# Patient Record
Sex: Female | Born: 1945 | Race: White | Hispanic: No | Marital: Married | Smoking: Never smoker
Health system: Southern US, Community
[De-identification: ages and names within clinical notes are randomized; demographics above are authoritative.]

---

## 2013-10-06 HISTORY — PX: BREAST BIOPSY: SHX20

## 2016-05-09 DIAGNOSIS — B079 Viral wart, unspecified: Secondary | ICD-10-CM | POA: Insufficient documentation

## 2020-05-29 HISTORY — PX: BREAST BIOPSY: SHX20

## 2020-08-07 ENCOUNTER — Other Ambulatory Visit: Payer: Self-pay | Admitting: Family Medicine

## 2020-08-07 ENCOUNTER — Other Ambulatory Visit (HOSPITAL_COMMUNITY): Payer: Self-pay | Admitting: Family Medicine

## 2020-08-07 DIAGNOSIS — T148XXA Other injury of unspecified body region, initial encounter: Secondary | ICD-10-CM

## 2020-08-07 DIAGNOSIS — S60352D Superficial foreign body of left thumb, subsequent encounter: Secondary | ICD-10-CM

## 2020-08-09 ENCOUNTER — Other Ambulatory Visit: Payer: Self-pay

## 2020-08-09 ENCOUNTER — Ambulatory Visit
Admission: RE | Admit: 2020-08-09 | Discharge: 2020-08-09 | Disposition: A | Discharge status: Home / Self Care | Payer: Medicare Other | Source: Ambulatory Visit | Attending: Family Medicine | Admitting: Family Medicine

## 2020-08-09 DIAGNOSIS — T148XXA Other injury of unspecified body region, initial encounter: Secondary | ICD-10-CM | POA: Insufficient documentation

## 2020-08-09 DIAGNOSIS — S60352D Superficial foreign body of left thumb, subsequent encounter: Secondary | ICD-10-CM | POA: Insufficient documentation

## 2021-08-06 ENCOUNTER — Other Ambulatory Visit: Payer: Self-pay | Admitting: Obstetrics and Gynecology

## 2021-08-10 ENCOUNTER — Other Ambulatory Visit: Payer: Self-pay | Admitting: Obstetrics and Gynecology

## 2021-08-10 DIAGNOSIS — Z9889 Other specified postprocedural states: Secondary | ICD-10-CM

## 2021-08-10 DIAGNOSIS — N649 Disorder of breast, unspecified: Secondary | ICD-10-CM

## 2021-09-05 ENCOUNTER — Other Ambulatory Visit: Payer: Medicare Other

## 2021-09-18 ENCOUNTER — Ambulatory Visit
Admission: RE | Admit: 2021-09-18 | Discharge: 2021-09-18 | Disposition: A | Discharge status: Home / Self Care | Payer: Medicare Other | Source: Ambulatory Visit | Attending: Obstetrics and Gynecology | Admitting: Obstetrics and Gynecology

## 2021-09-18 ENCOUNTER — Other Ambulatory Visit: Payer: Self-pay

## 2021-09-18 DIAGNOSIS — Z9889 Other specified postprocedural states: Secondary | ICD-10-CM

## 2021-09-18 DIAGNOSIS — N649 Disorder of breast, unspecified: Secondary | ICD-10-CM | POA: Diagnosis present

## 2021-10-16 DIAGNOSIS — M792 Neuralgia and neuritis, unspecified: Secondary | ICD-10-CM | POA: Insufficient documentation

## 2021-12-10 ENCOUNTER — Ambulatory Visit (INDEPENDENT_AMBULATORY_CARE_PROVIDER_SITE_OTHER): Payer: Medicare Other | Admitting: Dermatology

## 2021-12-10 DIAGNOSIS — L988 Other specified disorders of the skin and subcutaneous tissue: Secondary | ICD-10-CM

## 2021-12-10 DIAGNOSIS — B079 Viral wart, unspecified: Secondary | ICD-10-CM

## 2021-12-10 DIAGNOSIS — D1800 Hemangioma unspecified site: Secondary | ICD-10-CM

## 2021-12-10 DIAGNOSIS — L209 Atopic dermatitis, unspecified: Secondary | ICD-10-CM

## 2021-12-10 DIAGNOSIS — L578 Other skin changes due to chronic exposure to nonionizing radiation: Secondary | ICD-10-CM

## 2021-12-10 DIAGNOSIS — I8393 Asymptomatic varicose veins of bilateral lower extremities: Secondary | ICD-10-CM

## 2021-12-10 DIAGNOSIS — L219 Seborrheic dermatitis, unspecified: Secondary | ICD-10-CM

## 2021-12-10 DIAGNOSIS — Z85828 Personal history of other malignant neoplasm of skin: Secondary | ICD-10-CM

## 2021-12-10 DIAGNOSIS — L570 Actinic keratosis: Secondary | ICD-10-CM | POA: Diagnosis not present

## 2021-12-10 DIAGNOSIS — L820 Inflamed seborrheic keratosis: Secondary | ICD-10-CM | POA: Diagnosis not present

## 2021-12-10 DIAGNOSIS — Z1283 Encounter for screening for malignant neoplasm of skin: Secondary | ICD-10-CM | POA: Diagnosis not present

## 2021-12-10 DIAGNOSIS — L918 Other hypertrophic disorders of the skin: Secondary | ICD-10-CM

## 2021-12-10 DIAGNOSIS — L821 Other seborrheic keratosis: Secondary | ICD-10-CM

## 2021-12-10 MED ORDER — KETOCONAZOLE 2 % EX SHAM: MEDICATED_SHAMPOO | CUTANEOUS | 11 refills | Status: DC

## 2021-12-10 MED ORDER — MOMETASONE FUROATE 0.1 % EX CREA: TOPICAL_CREAM | CUTANEOUS | 3 refills | Status: DC

## 2021-12-10 NOTE — Progress Notes (Signed)
New Patient Visit  Subjective  Teresa Cohen is a 76 y.o. 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 Spots at nose , right bridge of nose, left upper lip, left side of face, spot at right knee, right shoulder, growth at right inframammary. Reports a possible at right 4th finger that wont go away. Also reports itchy skin on scalp. ). The patient presents for Total-Body Skin Exam (TBSE) for skin cancer screening and mole check.  The patient has spots, moles and lesions to be evaluated, some may be new or changing and the patient has concerns that these could be cancer.  The following portions of the chart were reviewed this encounter and updated as appropriate:   Allergies  Meds  Problems  Med Hx  Surg Hx  Fam Hx     Review of Systems:  No other skin or systemic complaints except as noted in HPI or Assessment and Plan.  Objective  Well appearing patient in no apparent distress; mood and affect are within normal limits.  A full examination was performed including scalp, head, eyes, ears, nose, lips, neck, chest, axillae, abdomen, back, buttocks, bilateral upper extremities, bilateral lower extremities, hands, feet, fingers, toes, fingernails, and toenails. All findings within normal limits unless otherwise noted below.  Right 4th Finger Tip 0.3 cm wart   right hand base of thumb x 1, right tricep x 1, right shoulder x 1, right inframammary x 1, right knee x 1 (5) Erythematous stuck-on, waxy papule or plaque  left dorsum hand x 1 Erythematous thin papules/macules with gritty scale.   face Rhytides and volume loss.    Assessment & Plan  Atopic dermatitis, unspecified type b/l postauricular areas Atopic dermatitis (eczema) is a chronic, relapsing, pruritic condition that can significantly affect quality of life. It is often associated with allergic rhinitis and/or asthma and can  require treatment with topical medications, phototherapy, or in severe cases biologic injectable medication (Dupixent; Adbry) or Oral JAK inhibitors.  Mometasone use 5 days weekly to aa's daily   mometasone (ELOCON) 0.1 % cream - b/l postauricular areas Apply topically to aa's of ears daily 5 x weekly for eczema  Seborrheic dermatitis Scalp Seborrheic Dermatitis  -  is a chronic persistent rash characterized by pinkness and scaling most commonly of the mid face but also can occur on the scalp (dandruff), ears; mid chest, mid back and groin.  It tends to be exacerbated by stress and cooler weather.  People who have neurologic disease may experience new onset or exacerbation of existing seborrheic dermatitis.  The condition is not curable but treatable and can be controlled.  Ketoconazole shampoo - use 3 x weekly apply three times per week, massage into scalp and leave in for 10 minutes before rinsing out  ketoconazole (NIZORAL) 2 % shampoo - Scalp apply three times per week, massage into scalp and leave for a few minutes before rinsing out  Viral warts, unspecified type Right 4th Finger Tip Discussed viral etiology and risk of spread.  Discussed multiple treatments may be required to clear warts.  Discussed possible post-treatment dyspigmentation and risk of recurrence.  Destruction of lesion - Right 4th Finger Tip Complexity: simple   Destruction method: cryotherapy   Informed consent: discussed and consent obtained   Timeout:  patient name, date of birth, surgical site, and procedure verified Lesion destroyed using liquid nitrogen: Yes   Region frozen until ice  ball extended beyond lesion: Yes   Outcome: patient tolerated procedure well with no complications   Post-procedure details: wound care instructions given   Additional details:  Prior to procedure, discussed risks of blister formation, small wound, skin dyspigmentation, or rare scar following cryotherapy. Recommend Vaseline  ointment to treated areas while healing.  Inflamed seborrheic keratosis (5) right hand base of thumb x 1, right tricep x 1, right shoulder x 1, right inframammary x 1, right knee x 1  Symptomatic, irritating, patient would like treated.  Destruction of lesion - right hand base of thumb x 1, right tricep x 1, right shoulder x 1, right inframammary x 1, right knee x 1 Complexity: simple   Destruction method: cryotherapy   Informed consent: discussed and consent obtained   Timeout:  patient name, date of birth, surgical site, and procedure verified Lesion destroyed using liquid nitrogen: Yes   Region frozen until ice ball extended beyond lesion: Yes   Outcome: patient tolerated procedure well with no complications   Post-procedure details: wound care instructions given   Additional details:  Prior to procedure, discussed risks of blister formation, small wound, skin dyspigmentation, or rare scar following cryotherapy. Recommend Vaseline ointment to treated areas while healing.  Actinic keratosis left dorsum hand x 1 Actinic keratoses are precancerous spots that appear secondary to cumulative UV radiation exposure/sun exposure over time. They are chronic with expected duration over 1 year. A portion of actinic keratoses will progress to squamous cell carcinoma of the skin. It is not possible to reliably predict which spots will progress to skin cancer and so treatment is recommended to prevent development of skin cancer.  Recommend daily broad spectrum sunscreen SPF 30+ to sun-exposed areas, reapply every 2 hours as needed.  Recommend staying in the shade or wearing long sleeves, sun glasses (UVA+UVB protection) and wide brim hats (4-inch brim around the entire circumference of the hat). Call for new or changing lesions.  Destruction of lesion - left dorsum hand x 1 Complexity: simple   Destruction method: cryotherapy   Informed consent: discussed and consent obtained   Timeout:  patient  name, date of birth, surgical site, and procedure verified Lesion destroyed using liquid nitrogen: Yes   Region frozen until ice ball extended beyond lesion: Yes   Outcome: patient tolerated procedure well with no complications   Post-procedure details: wound care instructions given   Additional details:  Prior to procedure, discussed risks of blister formation, small wound, skin dyspigmentation, or rare scar following cryotherapy. Recommend Vaseline ointment to treated areas while healing.  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  Typically we recommend 3 treatment sessions about 5-8 weeks apart for best results.  The patient's condition may require "maintenance treatments" in the future.  The fee for BBL / laser treatments is $350 per treatment session for the whole face.  A fee can be quoted for other parts of the body. Insurance typically does not pay for BBL/laser treatments and therefore the fee is an out-of-pocket cost.  Lentigines - Scattered tan macules - Due to sun exposure - Benign-appearing, observe - Recommend daily broad spectrum sunscreen SPF 30+ to sun-exposed areas, reapply every 2 hours as needed. - Call for any changes  Seborrheic Keratoses - Stuck-on, waxy, tan-brown papules and/or plaques  - Benign-appearing - Discussed benign etiology and prognosis. - Observe - Call for any  changes  Melanocytic Nevi - Tan-brown and/or pink-flesh-colored symmetric macules and papules - Benign appearing on exam today - Observation - Call clinic for new or changing moles - Recommend daily use of broad spectrum spf 30+ sunscreen to sun-exposed areas.   Hemangiomas - Red papules - Discussed benign nature - Observe - Call for any changes  Varicose Veins/Spider Veins - Dilated blue, purple or red veins at the lower extremities - Reassured - Smaller  vessels can be treated by sclerotherapy (a procedure to inject a medicine into the veins to make them disappear) if desired, but the treatment is not covered by insurance. Larger vessels may be covered if symptomatic and we would refer to vascular surgeon if treatment desired.  Actinic Damage - Chronic condition, secondary to cumulative UV/sun exposure - diffuse scaly erythematous macules with underlying dyspigmentation - Recommend daily broad spectrum sunscreen SPF 30+ to sun-exposed areas, reapply every 2 hours as needed.  - Staying in the shade or wearing long sleeves, sun glasses (UVA+UVB protection) and wide brim hats (4-inch brim around the entire circumference of the hat) are also recommended for sun protection.  - Call for new or changing lesions.  History of Skin Cancer   At left ear 20 years ago, treated with Mohs Clear. Observe for recurrence.  Call clinic for new or changing lesions.   Recommend regular skin exams, daily broad-spectrum spf 30+ sunscreen use, and photoprotection.     Skin cancer screening performed today. Return for 3 - 4 month ak follow up, 1 year tbse .  IRuthell Rummage, CMA, am acting as scribe for Sarina Ser, MD. Documentation: I have reviewed the above documentation for accuracy and completeness, and I agree with the above.  Sarina Ser, MD

## 2021-12-10 NOTE — Patient Instructions (Addendum)
Actinic keratoses are precancerous spots that appear secondary to cumulative UV radiation exposure/sun exposure over time. They are chronic with expected duration over 1 year. A portion of actinic keratoses will progress to squamous cell carcinoma of the skin. It is not possible to reliably predict which spots will progress to skin cancer and so treatment is recommended to prevent development of skin cancer.  Recommend daily broad spectrum sunscreen SPF 30+ to sun-exposed areas, reapply every 2 hours as needed.  Recommend staying in the shade or wearing long sleeves, sun glasses (UVA+UVB protection) and wide brim hats (4-inch brim around the entire circumference of the hat). Call for new or changing lesions.   Cryotherapy Aftercare  Wash gently with soap and water everyday.   Apply Vaseline and Band-Aid daily until healed.    Seborrheic Keratosis  What causes seborrheic keratoses? Seborrheic keratoses are harmless, common skin growths that first appear during adult life.  As time goes by, more growths appear.  Some people may develop a large number of them.  Seborrheic keratoses appear on both covered and uncovered body parts.  They are not caused by sunlight.  The tendency to develop seborrheic keratoses can be inherited.  They vary in color from skin-colored to gray, brown, or even black.  They can be either smooth or have a rough, warty surface.   Seborrheic keratoses are superficial and look as if they were stuck on the skin.  Under the microscope this type of keratosis looks like layers upon layers of skin.  That is why at times the top layer may seem to fall off, but the rest of the growth remains and re-grows.    Treatment Seborrheic keratoses do not need to be treated, but can easily be removed in the office.  Seborrheic keratoses often cause symptoms when they rub on clothing or jewelry.  Lesions can be in the way of shaving.  If they become inflamed, they can cause itching, soreness, or  burning.  Removal of a seborrheic keratosis can be accomplished by freezing, burning, or surgery. If any spot bleeds, scabs, or grows rapidly, please return to have it checked, as these can be an indication of a skin cancer.  Melanoma ABCDEs  Melanoma is the most dangerous type of skin cancer, and is the leading cause of death from skin disease.  You are more likely to develop melanoma if you: Have light-colored skin, light-colored eyes, or red or blond hair Spend a lot of time in the sun Tan regularly, either outdoors or in a tanning bed Have had blistering sunburns, especially during childhood Have a close family member who has had a melanoma Have atypical moles or large birthmarks  Early detection of melanoma is key since treatment is typically straightforward and cure rates are extremely high if we catch it early.   The first sign of melanoma is often a change in a mole or a new dark spot.  The ABCDE system is a way of remembering the signs of melanoma.  A for asymmetry:  The two halves do not match. B for border:  The edges of the growth are irregular. C for color:  A mixture of colors are present instead of an even brown color. D for diameter:  Melanomas are usually (but not always) greater than 6mm - the size of a pencil eraser. E for evolution:  The spot keeps changing in size, shape, and color.  Please check your skin once per month between visits. You can use a small   mirror in front and a large mirror behind you to keep an eye on the back side or your body.   If you see any new or changing lesions before your next follow-up, please call to schedule a visit.  Please continue daily skin protection including broad spectrum sunscreen SPF 30+ to sun-exposed areas, reapplying every 2 hours as needed when you're outdoors.   Staying in the shade or wearing long sleeves, sun glasses (UVA+UVB protection) and wide brim hats (4-inch brim around the entire circumference of the hat) are also  recommended for sun protection.    Due to recent changes in healthcare laws, you may see results of your pathology and/or laboratory studies on MyChart before the doctors have had a chance to review them. We understand that in some cases there may be results that are confusing or concerning to you. Please understand that not all results are received at the same time and often the doctors may need to interpret multiple results in order to provide you with the best plan of care or course of treatment. Therefore, we ask that you please give us 2 business days to thoroughly review all your results before contacting the office for clarification. Should we see a critical lab result, you will be contacted sooner.   If You Need Anything After Your Visit  If you have any questions or concerns for your doctor, please call our main line at [phone] and press option 4 to reach your doctor's medical assistant. If no one answers, please leave a voicemail as directed and we will return your call as soon as possible. Messages left after 4 pm will be answered the following business day.   You may also send us a message via MyChart. We typically respond to MyChart messages within 1-2 business days.  For prescription refills, please ask your pharmacy to contact our office. Our fax number is [phone].  If you have an urgent issue when the clinic is closed that cannot wait until the next business day, you can page your doctor at the number below.    Please note that while we do our best to be available for urgent issues outside of office hours, we are not available 24/7.   If you have an urgent issue and are unable to reach us, you may choose to seek medical care at your doctor's office, retail clinic, urgent care center, or emergency room.  If you have a medical emergency, please immediately call 911 or go to the emergency department.  Pager Numbers  - Dr. Kowalski: [phone]  - Dr. Moye:  [phone]  - Dr. Stewart: [phone]  In the event of inclement weather, please call our main line at [phone] for an update on the status of any delays or closures.  Dermatology Medication Tips: Please keep the boxes that topical medications come in in order to help keep track of the instructions about where and how to use these. Pharmacies typically print the medication instructions only on the boxes and not directly on the medication tubes.   If your medication is too expensive, please contact our office at [phone] option 4 or send us a message through MyChart.   We are unable to tell what your co-pay for medications will be in advance as this is different depending on your insurance coverage. However, we may be able to find a substitute medication at lower cost or fill out paperwork to get insurance to cover a needed medication.   If a prior authorization   is required to get your medication covered by your insurance company, please allow us 1-2 business days to complete this process.  Drug prices often vary depending on where the prescription is filled and some pharmacies may offer cheaper prices.  The website www.goodrx.com contains coupons for medications through different pharmacies. The prices here do not account for what the cost may be with help from insurance (it may be cheaper with your insurance), but the website can give you the price if you did not use any insurance.  - You can print the associated coupon and take it with your prescription to the pharmacy.  - You may also stop by our office during regular business hours and pick up a GoodRx coupon card.  - If you need your prescription sent electronically to a different pharmacy, notify our office through [facility] MyChart or by phone at [phone] option 4.     Si Usted Necesita Algo Despus de Su Visita  Tambin puede enviarnos un mensaje a travs de MyChart. Por lo general respondemos a los mensajes de  MyChart en el transcurso de 1 a 2 das hbiles.  Para renovar recetas, por favor pida a su farmacia que se ponga en contacto con nuestra oficina. Nuestro nmero de fax es el [phone].  Si tiene un asunto urgente cuando la clnica est cerrada y que no puede esperar hasta el siguiente da hbil, puede llamar/localizar a su doctor(a) al nmero que aparece a continuacin.   Por favor, tenga en cuenta que aunque hacemos todo lo posible para estar disponibles para asuntos urgentes fuera del horario de oficina, no estamos disponibles las 24 horas del da, los 7 das de la semana.   Si tiene un problema urgente y no puede comunicarse con nosotros, puede optar por buscar atencin mdica  en el consultorio de su doctor(a), en una clnica privada, en un centro de atencin urgente o en una sala de emergencias.  Si tiene una emergencia mdica, por favor llame inmediatamente al 911 o vaya a la sala de emergencias.  Nmeros de bper  - Dr. Kowalski: [phone]  - Dra. Moye: [phone]  - Dra. Stewart: [phone]  En caso de inclemencias del tiempo, por favor llame a nuestra lnea principal al [phone] para una actualizacin sobre el estado de cualquier retraso o cierre.  Consejos para la medicacin en dermatologa: Por favor, guarde las cajas en las que vienen los medicamentos de uso tpico para ayudarle a seguir las instrucciones sobre dnde y cmo usarlos. Las farmacias generalmente imprimen las instrucciones del medicamento slo en las cajas y no directamente en los tubos del medicamento.   Si su medicamento es muy caro, por favor, pngase en contacto con nuestra oficina llamando al [phone] y presione la opcin 4 o envenos un mensaje a travs de MyChart.   No podemos decirle cul ser su copago por los medicamentos por adelantado ya que esto es diferente dependiendo de la cobertura de su seguro. Sin embargo, es posible que podamos encontrar un medicamento sustituto a menor costo o  llenar un formulario para que el seguro cubra el medicamento que se considera necesario.   Si se requiere una autorizacin previa para que su compaa de seguros cubra su medicamento, por favor permtanos de 1 a 2 das hbiles para completar este proceso.  Los precios de los medicamentos varan con frecuencia dependiendo del lugar de dnde se surte la receta y alguna farmacias pueden ofrecer precios ms baratos.  El sitio web www.goodrx.com tiene cupones para medicamentos   de diferentes farmacias. Los precios aqu no tienen en cuenta lo que podra costar con la ayuda del seguro (puede ser ms barato con su seguro), pero el sitio web puede darle el precio si no utiliz ningn seguro.  - Puede imprimir el cupn correspondiente y llevarlo con su receta a la farmacia.  - Tambin puede pasar por nuestra oficina durante el horario de atencin regular y recoger una tarjeta de cupones de GoodRx.  - Si necesita que su receta se enve electrnicamente a una farmacia diferente, informe a nuestra oficina a travs de MyChart de [place] o por telfono llamando al [phone] y presione la opcin 4.  

## 2021-12-11 ENCOUNTER — Encounter: Payer: Self-pay | Admitting: Dermatology

## 2022-02-11 DIAGNOSIS — M659 Synovitis and tenosynovitis, unspecified: Secondary | ICD-10-CM | POA: Insufficient documentation

## 2022-02-13 DIAGNOSIS — M7751 Other enthesopathy of right foot: Secondary | ICD-10-CM | POA: Insufficient documentation

## 2022-02-14 DIAGNOSIS — M5136 Other intervertebral disc degeneration, lumbar region: Secondary | ICD-10-CM | POA: Insufficient documentation

## 2022-02-14 DIAGNOSIS — M7918 Myalgia, other site: Secondary | ICD-10-CM | POA: Insufficient documentation

## 2022-03-13 ENCOUNTER — Ambulatory Visit (INDEPENDENT_AMBULATORY_CARE_PROVIDER_SITE_OTHER): Payer: Medicare Other | Admitting: Dermatology

## 2022-03-13 DIAGNOSIS — B079 Viral wart, unspecified: Secondary | ICD-10-CM | POA: Diagnosis not present

## 2022-03-13 DIAGNOSIS — L820 Inflamed seborrheic keratosis: Secondary | ICD-10-CM

## 2022-03-13 DIAGNOSIS — L570 Actinic keratosis: Secondary | ICD-10-CM | POA: Diagnosis not present

## 2022-03-13 DIAGNOSIS — L578 Other skin changes due to chronic exposure to nonionizing radiation: Secondary | ICD-10-CM

## 2022-03-13 DIAGNOSIS — L719 Rosacea, unspecified: Secondary | ICD-10-CM | POA: Diagnosis not present

## 2022-03-13 DIAGNOSIS — L219 Seborrheic dermatitis, unspecified: Secondary | ICD-10-CM | POA: Diagnosis not present

## 2022-03-13 DIAGNOSIS — L821 Other seborrheic keratosis: Secondary | ICD-10-CM

## 2022-03-13 DIAGNOSIS — L814 Other melanin hyperpigmentation: Secondary | ICD-10-CM | POA: Diagnosis not present

## 2022-03-13 NOTE — Progress Notes (Signed)
Follow-Up Visit   Subjective  Teresa Cohen is a 76 y.o. female who presents for the following: Actinic Keratosis (Recheck previously treated area on the L hand dorsum, previously treated with LN2). Residual wart on the R 4th finger. New skin lesion on the R nasal bridge area. Residual ISK on the L thumb and R upper arm. The patient has spots, moles and lesions to be evaluated, some may be new or changing.  The following portions of the chart were reviewed this encounter and updated as appropriate:   Allergies  Meds  Problems  Med Hx  Surg Hx  Fam Hx     Review of Systems:  No other skin or systemic complaints except as noted in HPI or Assessment and Plan.  Objective  Well appearing patient in no apparent distress; mood and affect are within normal limits.  A focused examination was performed including the face and extremities. Relevant physical exam findings are noted in the Assessment and Plan.  Scalp, eyelids Pink patches with greasy scale.   L hand dorsum x 3, R nasal bridge x 1 (4) Erythematous thin papules/macules with gritty scale.   R 4th finger x 1 Verrucous papules -- Discussed viral etiology and contagion.   L thumb x 2, R upper arm x 2 (4) Erythematous stuck-on, waxy papule or plaque  Face Pinkness on the nose and cheeks.                        Assessment & Plan  Seborrheic dermatitis Scalp, eyelids Seborrheic Dermatitis  -  is a chronic persistent rash characterized by pinkness and scaling most commonly of the mid face but also can occur on the scalp (dandruff), ears; mid chest, mid back and groin.  It tends to be exacerbated by stress and cooler weather.  People who have neurologic disease may experience new onset or exacerbation of existing seborrheic dermatitis.  The condition is not curable but treatable and can be controlled.  Continue Ketoconazole 2% shampoo to scalp and eyelids 3d/wk. Let sit 5-10 minutes then wash off.   Related  Medications ketoconazole (NIZORAL) 2 % shampoo apply three times per week, massage into scalp and leave for a few minutes before rinsing out  AK (actinic keratosis) (4) L hand dorsum x 3, R nasal bridge x 1 Destruction of lesion - L hand dorsum x 3, R nasal bridge x 1 Complexity: simple   Destruction method: cryotherapy   Informed consent: discussed and consent obtained   Timeout:  patient name, date of birth, surgical site, and procedure verified Lesion destroyed using liquid nitrogen: Yes   Region frozen until ice ball extended beyond lesion: Yes   Outcome: patient tolerated procedure well with no complications   Post-procedure details: wound care instructions given    Viral warts, unspecified type R 4th finger x 1 Discussed viral etiology and risk of spread.  Discussed multiple treatments may be required to clear warts.  Discussed possible post-treatment dyspigmentation and risk of recurrence.  Destruction of lesion - R 4th finger x 1 Complexity: simple   Destruction method: cryotherapy   Informed consent: discussed and consent obtained   Timeout:  patient name, date of birth, surgical site, and procedure verified Lesion destroyed using liquid nitrogen: Yes   Region frozen until ice ball extended beyond lesion: Yes   Outcome: patient tolerated procedure well with no complications   Post-procedure details: wound care instructions given    Inflamed seborrheic keratosis (4) L  thumb x 2, R upper arm x 2 Symptomatic, irritating, patient would like treated. Destruction of lesion - L thumb x 2, R upper arm x 2 Complexity: simple   Destruction method: cryotherapy   Informed consent: discussed and consent obtained   Timeout:  patient name, date of birth, surgical site, and procedure verified Lesion destroyed using liquid nitrogen: Yes   Region frozen until ice ball extended beyond lesion: Yes   Outcome: patient tolerated procedure well with no complications   Post-procedure  details: wound care instructions given    Rosacea with telangiectasias Face Rosacea is a chronic progressive skin condition usually affecting the face of adults, causing redness and/or acne bumps. It is treatable but not curable. It sometimes affects the eyes (ocular rosacea) as well. It may respond to topical and/or systemic medication and can flare with stress, sun exposure, alcohol, exercise and some foods.  Daily application of broad spectrum spf 30+ sunscreen to face is recommended to reduce flares.  Discussed the treatment option of BBL/laser.  Typically we recommend 1-3 treatment sessions about 5-8 weeks apart for best results.  The patient's condition may require "maintenance treatments" in the future.  The fee for BBL / laser treatments is $350 per treatment session for the whole face.  A fee can be quoted for other parts of the body. Insurance typically does not pay for BBL/laser treatments and therefore the fee is an out-of-pocket cost.  Seborrheic Keratoses - Stuck-on, waxy, tan-brown papules and/or plaques  - Benign-appearing - Discussed benign etiology and prognosis. - Observe - Call for any changes  Actinic Damage - chronic, secondary to cumulative UV radiation exposure/sun exposure over time - diffuse scaly erythematous macules with underlying dyspigmentation - Recommend daily broad spectrum sunscreen SPF 30+ to sun-exposed areas, reapply every 2 hours as needed.  - Recommend staying in the shade or wearing long sleeves, sun glasses (UVA+UVB protection) and wide brim hats (4-inch brim around the entire circumference of the hat). - Call for new or changing lesions.  Lentigines - Scattered tan macules - Due to sun exposure - Benign-appering, observe - Recommend daily broad spectrum sunscreen SPF 30+ to sun-exposed areas, reapply every 2 hours as needed. - Call for any changes - Discussed the treatment option of BBL/laser.  Typically we recommend 1-3 treatment sessions about  5-8 weeks apart for best results.  The patient's condition may require "maintenance treatments" in the future.  The fee for BBL / laser treatments is $350 per treatment session for the whole face.  A fee can be quoted for other parts of the body. Insurance typically does not pay for BBL/laser treatments and therefore the fee is an out-of-pocket cost.  Return in about 1 year (around 03/14/2023) for TBSE.  Luther Redo, CMA, am acting as scribe for Sarina Ser, MD . Documentation: I have reviewed the above documentation for accuracy and completeness, and I agree with the above.  Sarina Ser, MD

## 2022-03-13 NOTE — Patient Instructions (Signed)
Due to recent changes in healthcare laws, you may see results of your pathology and/or laboratory studies on MyChart before the doctors have had a chance to review them. We understand that in some cases there may be results that are confusing or concerning to you. Please understand that not all results are received at the same time and often the doctors may need to interpret multiple results in order to provide you with the best plan of care or course of treatment. Therefore, we ask that you please give us 2 business days to thoroughly review all your results before contacting the office for clarification. Should we see a critical lab result, you will be contacted sooner.   If You Need Anything After Your Visit  If you have any questions or concerns for your doctor, please call our main line at [phone] and press option 4 to reach your doctor's medical assistant. If no one answers, please leave a voicemail as directed and we will return your call as soon as possible. Messages left after 4 pm will be answered the following business day.   You may also send us a message via MyChart. We typically respond to MyChart messages within 1-2 business days.  For prescription refills, please ask your pharmacy to contact our office. Our fax number is [phone].  If you have an urgent issue when the clinic is closed that cannot wait until the next business day, you can page your doctor at the number below.    Please note that while we do our best to be available for urgent issues outside of office hours, we are not available 24/7.   If you have an urgent issue and are unable to reach us, you may choose to seek medical care at your doctor's office, retail clinic, urgent care center, or emergency room.  If you have a medical emergency, please immediately call 911 or go to the emergency department.  Pager Numbers  - Dr. Kowalski: [phone]  - Dr. Moye: [phone]  - Dr. Stewart:  [phone]  In the event of inclement weather, please call our main line at [phone] for an update on the status of any delays or closures.  Dermatology Medication Tips: Please keep the boxes that topical medications come in in order to help keep track of the instructions about where and how to use these. Pharmacies typically print the medication instructions only on the boxes and not directly on the medication tubes.   If your medication is too expensive, please contact our office at [phone] option 4 or send us a message through MyChart.   We are unable to tell what your co-pay for medications will be in advance as this is different depending on your insurance coverage. However, we may be able to find a substitute medication at lower cost or fill out paperwork to get insurance to cover a needed medication.   If a prior authorization is required to get your medication covered by your insurance company, please allow us 1-2 business days to complete this process.  Drug prices often vary depending on where the prescription is filled and some pharmacies may offer cheaper prices.  The website www.goodrx.com contains coupons for medications through different pharmacies. The prices here do not account for what the cost may be with help from insurance (it may be cheaper with your insurance), but the website can give you the price if you did not use any insurance.  - You can print the associated coupon and take it with   your prescription to the pharmacy.  - You may also stop by our office during regular business hours and pick up a GoodRx coupon card.  - If you need your prescription sent electronically to a different pharmacy, notify our office through [facility] MyChart or by phone at [phone] option 4.     Si Usted Necesita Algo Despus de Su Visita  Tambin puede enviarnos un mensaje a travs de MyChart. Por lo general respondemos a los mensajes de MyChart en el transcurso de 1 a 2  das hbiles.  Para renovar recetas, por favor pida a su farmacia que se ponga en contacto con nuestra oficina. Nuestro nmero de fax es el [phone].  Si tiene un asunto urgente cuando la clnica est cerrada y que no puede esperar hasta el siguiente da hbil, puede llamar/localizar a su doctor(a) al nmero que aparece a continuacin.   Por favor, tenga en cuenta que aunque hacemos todo lo posible para estar disponibles para asuntos urgentes fuera del horario de oficina, no estamos disponibles las 24 horas del da, los 7 das de la semana.   Si tiene un problema urgente y no puede comunicarse con nosotros, puede optar por buscar atencin mdica  en el consultorio de su doctor(a), en una clnica privada, en un centro de atencin urgente o en una sala de emergencias.  Si tiene una emergencia mdica, por favor llame inmediatamente al 911 o vaya a la sala de emergencias.  Nmeros de bper  - Dr. Kowalski: [phone]  - Dra. Moye: [phone]  - Dra. Stewart: [phone]  En caso de inclemencias del tiempo, por favor llame a nuestra lnea principal al [phone] para una actualizacin sobre el estado de cualquier retraso o cierre.  Consejos para la medicacin en dermatologa: Por favor, guarde las cajas en las que vienen los medicamentos de uso tpico para ayudarle a seguir las instrucciones sobre dnde y cmo usarlos. Las farmacias generalmente imprimen las instrucciones del medicamento slo en las cajas y no directamente en los tubos del medicamento.   Si su medicamento es muy caro, por favor, pngase en contacto con nuestra oficina llamando al [phone] y presione la opcin 4 o envenos un mensaje a travs de MyChart.   No podemos decirle cul ser su copago por los medicamentos por adelantado ya que esto es diferente dependiendo de la cobertura de su seguro. Sin embargo, es posible que podamos encontrar un medicamento sustituto a menor costo o llenar un formulario para que el  seguro cubra el medicamento que se considera necesario.   Si se requiere una autorizacin previa para que su compaa de seguros cubra su medicamento, por favor permtanos de 1 a 2 das hbiles para completar este proceso.  Los precios de los medicamentos varan con frecuencia dependiendo del lugar de dnde se surte la receta y alguna farmacias pueden ofrecer precios ms baratos.  El sitio web www.goodrx.com tiene cupones para medicamentos de diferentes farmacias. Los precios aqu no tienen en cuenta lo que podra costar con la ayuda del seguro (puede ser ms barato con su seguro), pero el sitio web puede darle el precio si no utiliz ningn seguro.  - Puede imprimir el cupn correspondiente y llevarlo con su receta a la farmacia.  - Tambin puede pasar por nuestra oficina durante el horario de atencin regular y recoger una tarjeta de cupones de GoodRx.  - Si necesita que su receta se enve electrnicamente a una farmacia diferente, informe a nuestra oficina a travs de MyChart de [facility]   o por telfono llamando al [phone] y presione la opcin 4.  

## 2022-03-17 ENCOUNTER — Encounter: Payer: Self-pay | Admitting: Dermatology

## 2022-03-19 DIAGNOSIS — M5431 Sciatica, right side: Secondary | ICD-10-CM | POA: Insufficient documentation

## 2022-04-18 ENCOUNTER — Ambulatory Visit (INDEPENDENT_AMBULATORY_CARE_PROVIDER_SITE_OTHER): Payer: Medicare Other | Admitting: Dermatology

## 2022-04-18 DIAGNOSIS — L814 Other melanin hyperpigmentation: Secondary | ICD-10-CM

## 2022-04-18 DIAGNOSIS — L719 Rosacea, unspecified: Secondary | ICD-10-CM

## 2022-04-18 DIAGNOSIS — L570 Actinic keratosis: Secondary | ICD-10-CM | POA: Diagnosis not present

## 2022-04-18 DIAGNOSIS — B078 Other viral warts: Secondary | ICD-10-CM

## 2022-04-18 NOTE — Progress Notes (Signed)
Follow-Up Visit   Subjective  Teresa Cohen is a 76 y.o. female who presents for the following: Facial Elastosis (Patient here for BBL today ). The patient has spots, moles and lesions to be evaluated, some may be new or changing and the patient has concerns that these could be cancer.   The following portions of the chart were reviewed this encounter and updated as appropriate:   Allergies  Meds  Problems  Med Hx  Surg Hx  Fam Hx     Review of Systems:  No other skin or systemic complaints except as noted in HPI or Assessment and Plan.  Objective  Well appearing patient in no apparent distress; mood and affect are within normal limits.  A focused examination was performed including face. Relevant physical exam findings are noted in the Assessment and Plan.  right mid nose x 1, left infranasal upper lip x 1   (2) (2) Erythematous thin papules/macules with gritty scale.   face Mid face erythema with telangiectasias           Head - Anterior (Face) brown macules           right thumb Verrucous papules -- Discussed viral etiology and contagion.    Assessment & Plan  AK (actinic keratosis) (2) right mid nose x 1, left infranasal upper lip x 1   (2) Actinic keratoses are precancerous spots that appear secondary to cumulative UV radiation exposure/sun exposure over time. They are chronic with expected duration over 1 year. A portion of actinic keratoses will progress to squamous cell carcinoma of the skin. It is not possible to reliably predict which spots will progress to skin cancer and so treatment is recommended to prevent development of skin cancer.  Recommend daily broad spectrum sunscreen SPF 30+ to sun-exposed areas, reapply every 2 hours as needed.  Recommend staying in the shade or wearing long sleeves, sun glasses (UVA+UVB protection) and wide brim hats (4-inch brim around the entire circumference of the hat). Call for new or changing lesions.    Destruction of lesion - right mid nose x 1, left infranasal upper lip x 1   (2) Complexity: simple   Destruction method: cryotherapy   Informed consent: discussed and consent obtained   Timeout:  patient name, date of birth, surgical site, and procedure verified Lesion destroyed using liquid nitrogen: Yes   Region frozen until ice ball extended beyond lesion: Yes   Outcome: patient tolerated procedure well with no complications   Post-procedure details: wound care instructions given    Rosacea face Post op laser care bag given to patient  Photorejuvenation - face Prior to the procedure, the patient's past medical history, medications, allergies, and the rare but potential risks and complications were reviewed with the patient and a signed consent was obtained.  Pre and post treatment care was discussed and instructions provided.   [facility] BBL - 04/18/22 1400      Treatment Details   Date: 04/18/22    Treatment #: 1    Filter: 1st Pass;2nd Pass      1st Pass   Location: F    Device: filter 560    BBL j/cm2: 27    PW Msec Sec: 27    Cooling Temp: 20    Pulses: 57    92m: used this crystal     Patient tolerated the procedure well   Sun avoidance was stressed. The patient will call with any problems, questions or concerns prior to their next  appointment.  Lentigines Head - Anterior (Face)  Photorejuvenation - Head - Anterior (Face) Prior to the procedure, the patient's past medical history, medications, allergies, and the rare but potential risks and complications were reviewed with the patient and a signed consent was obtained.  Pre and post treatment care was discussed and instructions provided.   [facility] BBL - 04/18/22 1400      Treatment Details   Date: 04/18/22    Treatment #: 1    Filter: 1st Pass;2nd Pass      2nd Pass   Location: F    Device: 12    BBL j/cm2: 10    PW Msec Sec: 25    Pulses: 98     Patient tolerated the procedure well.   Nancy Fetter avoidance  was stressed. The patient will call with any problems, questions or concerns prior to their next appointment.  Photorejuvenation - Head - Anterior (Face)  Other viral warts right thumb Discussed viral etiology and risk of spread.  Discussed multiple treatments may be required to clear warts.  Discussed possible post-treatment dyspigmentation and risk of recurrence.   Destruction of lesion - right thumb Complexity: simple   Destruction method: cryotherapy   Informed consent: discussed and consent obtained   Timeout:  patient name, date of birth, surgical site, and procedure verified Lesion destroyed using liquid nitrogen: Yes   Region frozen until ice ball extended beyond lesion: Yes   Outcome: patient tolerated procedure well with no complications   Post-procedure details: wound care instructions given    Return for as scheduled in December for Sclerotherapy .  IMarye Round, CMA, am acting as scribe for Sarina Ser, MD .  Documentation: I have reviewed the above documentation for accuracy and completeness, and I agree with the above.  Sarina Ser, MD

## 2022-04-18 NOTE — Patient Instructions (Addendum)
Cryotherapy Aftercare  Wash gently with soap and water everyday.   Apply Vaseline and Band-Aid daily until healed.     Due to recent changes in healthcare laws, you may see results of your pathology and/or laboratory studies on MyChart before the doctors have had a chance to review them. We understand that in some cases there may be results that are confusing or concerning to you. Please understand that not all results are received at the same time and often the doctors may need to interpret multiple results in order to provide you with the best plan of care or course of treatment. Therefore, we ask that you please give us 2 business days to thoroughly review all your results before contacting the office for clarification. Should we see a critical lab result, you will be contacted sooner.   If You Need Anything After Your Visit  If you have any questions or concerns for your doctor, please call our main line at [phone] and press option 4 to reach your doctor's medical assistant. If no one answers, please leave a voicemail as directed and we will return your call as soon as possible. Messages left after 4 pm will be answered the following business day.   You may also send us a message via MyChart. We typically respond to MyChart messages within 1-2 business days.  For prescription refills, please ask your pharmacy to contact our office. Our fax number is [phone].  If you have an urgent issue when the clinic is closed that cannot wait until the next business day, you can page your doctor at the number below.    Please note that while we do our best to be available for urgent issues outside of office hours, we are not available 24/7.   If you have an urgent issue and are unable to reach us, you may choose to seek medical care at your doctor's office, retail clinic, urgent care center, or emergency room.  If you have a medical emergency, please immediately call 911 or go to the  emergency department.  Pager Numbers  - Dr. Kowalski: [phone]  - Dr. Moye: [phone]  - Dr. Stewart: [phone]  In the event of inclement weather, please call our main line at [phone] for an update on the status of any delays or closures.  Dermatology Medication Tips: Please keep the boxes that topical medications come in in order to help keep track of the instructions about where and how to use these. Pharmacies typically print the medication instructions only on the boxes and not directly on the medication tubes.   If your medication is too expensive, please contact our office at [phone] option 4 or send us a message through MyChart.   We are unable to tell what your co-pay for medications will be in advance as this is different depending on your insurance coverage. However, we may be able to find a substitute medication at lower cost or fill out paperwork to get insurance to cover a needed medication.   If a prior authorization is required to get your medication covered by your insurance company, please allow us 1-2 business days to complete this process.  Drug prices often vary depending on where the prescription is filled and some pharmacies may offer cheaper prices.  The website www.goodrx.com contains coupons for medications through different pharmacies. The prices here do not account for what the cost may be with help from insurance (it may be cheaper with your insurance), but the website can   give you the price if you did not use any insurance.  - You can print the associated coupon and take it with your prescription to the pharmacy.  - You may also stop by our office during regular business hours and pick up a GoodRx coupon card.  - If you need your prescription sent electronically to a different pharmacy, notify our office through [facility] MyChart or by phone at [phone] option 4.     Si Usted Necesita Algo Despus de Su Visita  Tambin puede  enviarnos un mensaje a travs de MyChart. Por lo general respondemos a los mensajes de MyChart en el transcurso de 1 a 2 das hbiles.  Para renovar recetas, por favor pida a su farmacia que se ponga en contacto con nuestra oficina. Nuestro nmero de fax es el [phone].  Si tiene un asunto urgente cuando la clnica est cerrada y que no puede esperar hasta el siguiente da hbil, puede llamar/localizar a su doctor(a) al nmero que aparece a continuacin.   Por favor, tenga en cuenta que aunque hacemos todo lo posible para estar disponibles para asuntos urgentes fuera del horario de oficina, no estamos disponibles las 24 horas del da, los 7 das de la semana.   Si tiene un problema urgente y no puede comunicarse con nosotros, puede optar por buscar atencin mdica  en el consultorio de su doctor(a), en una clnica privada, en un centro de atencin urgente o en una sala de emergencias.  Si tiene una emergencia mdica, por favor llame inmediatamente al 911 o vaya a la sala de emergencias.  Nmeros de bper  - Dr. Kowalski: [phone]  - Dra. Moye: [phone]  - Dra. Stewart: [phone]  En caso de inclemencias del tiempo, por favor llame a nuestra lnea principal al [phone] para una actualizacin sobre el estado de cualquier retraso o cierre.  Consejos para la medicacin en dermatologa: Por favor, guarde las cajas en las que vienen los medicamentos de uso tpico para ayudarle a seguir las instrucciones sobre dnde y cmo usarlos. Las farmacias generalmente imprimen las instrucciones del medicamento slo en las cajas y no directamente en los tubos del medicamento.   Si su medicamento es muy caro, por favor, pngase en contacto con nuestra oficina llamando al [phone] y presione la opcin 4 o envenos un mensaje a travs de MyChart.   No podemos decirle cul ser su copago por los medicamentos por adelantado ya que esto es diferente dependiendo de la cobertura de su seguro.  Sin embargo, es posible que podamos encontrar un medicamento sustituto a menor costo o llenar un formulario para que el seguro cubra el medicamento que se considera necesario.   Si se requiere una autorizacin previa para que su compaa de seguros cubra su medicamento, por favor permtanos de 1 a 2 das hbiles para completar este proceso.  Los precios de los medicamentos varan con frecuencia dependiendo del lugar de dnde se surte la receta y alguna farmacias pueden ofrecer precios ms baratos.  El sitio web www.goodrx.com tiene cupones para medicamentos de diferentes farmacias. Los precios aqu no tienen en cuenta lo que podra costar con la ayuda del seguro (puede ser ms barato con su seguro), pero el sitio web puede darle el precio si no utiliz ningn seguro.  - Puede imprimir el cupn correspondiente y llevarlo con su receta a la farmacia.  - Tambin puede pasar por nuestra oficina durante el horario de atencin regular y recoger una tarjeta de cupones de GoodRx.  -   Si necesita que su receta se enve electrnicamente a una farmacia diferente, informe a nuestra oficina a travs de MyChart de [place] o por telfono llamando al [phone] y presione la opcin 4.  

## 2022-04-28 ENCOUNTER — Encounter: Payer: Self-pay | Admitting: Dermatology

## 2022-05-06 ENCOUNTER — Ambulatory Visit: Payer: Medicare Other | Admitting: Dermatology

## 2022-06-06 ENCOUNTER — Telehealth: Payer: Self-pay

## 2022-06-06 ENCOUNTER — Ambulatory Visit (INDEPENDENT_AMBULATORY_CARE_PROVIDER_SITE_OTHER): Payer: Self-pay | Admitting: Dermatology

## 2022-06-06 DIAGNOSIS — L814 Other melanin hyperpigmentation: Secondary | ICD-10-CM

## 2022-06-06 DIAGNOSIS — L578 Other skin changes due to chronic exposure to nonionizing radiation: Secondary | ICD-10-CM

## 2022-06-06 DIAGNOSIS — L719 Rosacea, unspecified: Secondary | ICD-10-CM

## 2022-06-06 DIAGNOSIS — L738 Other specified follicular disorders: Secondary | ICD-10-CM

## 2022-06-06 NOTE — Telephone Encounter (Signed)
Tried calling patient to see if she has history of cold sores. Patient is scheduled for BBL today and wanted to inform her of need of valtrex rx before procedure to minimize risk of developing cold sores.

## 2022-06-06 NOTE — Progress Notes (Signed)
   Follow-Up Visit   Subjective  Teresa Cohen is a 76 y.o. female who presents for the following: BBL (Patient here for cosmetic laser treatment of lentigos) and OTHER (Spot at right side of nose. ). The patient has spots, moles and lesions to be evaluated, some may be new or changing and the patient has concerns that these could be cancer.  The following portions of the chart were reviewed this encounter and updated as appropriate:  Allergies  Meds  Problems  Med Hx  Surg Hx  Fam Hx     Review of Systems: No other skin or systemic complaints except as noted in HPI or Assessment and Plan.  Objective  Well appearing patient in no apparent distress; mood and affect are within normal limits.  A focused examination was performed including right nose, face. Relevant physical exam findings are noted in the Assessment and Plan.  face                  Assessment & Plan  Rosacea face Photorejuvenation - face Prior to the procedure, the patient's past medical history, medications, allergies, and the rare but potential risks and complications were reviewed with the patient and a signed consent was obtained.  Pre and post treatment care was discussed and instructions provided.    [place] BBL - 06/06/22 1700    Patient Details   Current Skin Care: face      Treatment Details   Date: 06/06/22    Treatment #: 2    Area: face      1st Pass   Location: F    Device: 560    BBL j/cm2: 28    PW Msec Sec: 26    Cooling Temp: 20    Pulses: 47      2nd Pass   Location: F    Device: 515    BBL j/cm2: 13    PW Msec Sec: 10    Cooling Temp: 25    Pulses: 127     Patient tolerated the procedure well   Sun avoidance was stressed. The patient will call with any problems, questions or concerns prior to their next appointment.  Lentigines - Scattered tan macules - Due to sun exposure - Benign-appearing, observe - Recommend daily broad spectrum sunscreen SPF 30+ to  sun-exposed areas, reapply every 2 hours as needed. 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  - Recommend staying in the shade or wearing long sleeves, sun glasses (UVA+UVB protection) and wide brim hats (4-inch brim around the entire circumference of the hat). - Call for new or changing lesions.  No follow-ups on file. IRuthell Rummage, CMA, am acting as scribe for Sarina Ser, MD. Documentation: I have reviewed the above documentation for accuracy and completeness, and I agree with the above.  Sarina Ser, MD

## 2022-06-06 NOTE — Patient Instructions (Signed)
Due to recent changes in healthcare laws, you may see results of your pathology and/or laboratory studies on MyChart before the doctors have had a chance to review them. We understand that in some cases there may be results that are confusing or concerning to you. Please understand that not all results are received at the same time and often the doctors may need to interpret multiple results in order to provide you with the best plan of care or course of treatment. Therefore, we ask that you please give us 2 business days to thoroughly review all your results before contacting the office for clarification. Should we see a critical lab result, you will be contacted sooner.   If You Need Anything After Your Visit  If you have any questions or concerns for your doctor, please call our main line at [phone] and press option 4 to reach your doctor's medical assistant. If no one answers, please leave a voicemail as directed and we will return your call as soon as possible. Messages left after 4 pm will be answered the following business day.   You may also send us a message via MyChart. We typically respond to MyChart messages within 1-2 business days.  For prescription refills, please ask your pharmacy to contact our office. Our fax number is [phone].  If you have an urgent issue when the clinic is closed that cannot wait until the next business day, you can page your doctor at the number below.    Please note that while we do our best to be available for urgent issues outside of office hours, we are not available 24/7.   If you have an urgent issue and are unable to reach us, you may choose to seek medical care at your doctor's office, retail clinic, urgent care center, or emergency room.  If you have a medical emergency, please immediately call 911 or go to the emergency department.  Pager Numbers  - Dr. Kowalski: [phone]  - Dr. Moye: [phone]  - Dr. Stewart:  [phone]  In the event of inclement weather, please call our main line at [phone] for an update on the status of any delays or closures.  Dermatology Medication Tips: Please keep the boxes that topical medications come in in order to help keep track of the instructions about where and how to use these. Pharmacies typically print the medication instructions only on the boxes and not directly on the medication tubes.   If your medication is too expensive, please contact our office at [phone] option 4 or send us a message through MyChart.   We are unable to tell what your co-pay for medications will be in advance as this is different depending on your insurance coverage. However, we may be able to find a substitute medication at lower cost or fill out paperwork to get insurance to cover a needed medication.   If a prior authorization is required to get your medication covered by your insurance company, please allow us 1-2 business days to complete this process.  Drug prices often vary depending on where the prescription is filled and some pharmacies may offer cheaper prices.  The website www.goodrx.com contains coupons for medications through different pharmacies. The prices here do not account for what the cost may be with help from insurance (it may be cheaper with your insurance), but the website can give you the price if you did not use any insurance.  - You can print the associated coupon and take it with   your prescription to the pharmacy.  - You may also stop by our office during regular business hours and pick up a GoodRx coupon card.  - If you need your prescription sent electronically to a different pharmacy, notify our office through [facility] MyChart or by phone at [phone] option 4.     Si Usted Necesita Algo Despus de Su Visita  Tambin puede enviarnos un mensaje a travs de MyChart. Por lo general respondemos a los mensajes de MyChart en el transcurso de 1 a 2  das hbiles.  Para renovar recetas, por favor pida a su farmacia que se ponga en contacto con nuestra oficina. Nuestro nmero de fax es el [phone].  Si tiene un asunto urgente cuando la clnica est cerrada y que no puede esperar hasta el siguiente da hbil, puede llamar/localizar a su doctor(a) al nmero que aparece a continuacin.   Por favor, tenga en cuenta que aunque hacemos todo lo posible para estar disponibles para asuntos urgentes fuera del horario de oficina, no estamos disponibles las 24 horas del da, los 7 das de la semana.   Si tiene un problema urgente y no puede comunicarse con nosotros, puede optar por buscar atencin mdica  en el consultorio de su doctor(a), en una clnica privada, en un centro de atencin urgente o en una sala de emergencias.  Si tiene una emergencia mdica, por favor llame inmediatamente al 911 o vaya a la sala de emergencias.  Nmeros de bper  - Dr. Kowalski: [phone]  - Dra. Moye: [phone]  - Dra. Stewart: [phone]  En caso de inclemencias del tiempo, por favor llame a nuestra lnea principal al [phone] para una actualizacin sobre el estado de cualquier retraso o cierre.  Consejos para la medicacin en dermatologa: Por favor, guarde las cajas en las que vienen los medicamentos de uso tpico para ayudarle a seguir las instrucciones sobre dnde y cmo usarlos. Las farmacias generalmente imprimen las instrucciones del medicamento slo en las cajas y no directamente en los tubos del medicamento.   Si su medicamento es muy caro, por favor, pngase en contacto con nuestra oficina llamando al [phone] y presione la opcin 4 o envenos un mensaje a travs de MyChart.   No podemos decirle cul ser su copago por los medicamentos por adelantado ya que esto es diferente dependiendo de la cobertura de su seguro. Sin embargo, es posible que podamos encontrar un medicamento sustituto a menor costo o llenar un formulario para que el  seguro cubra el medicamento que se considera necesario.   Si se requiere una autorizacin previa para que su compaa de seguros cubra su medicamento, por favor permtanos de 1 a 2 das hbiles para completar este proceso.  Los precios de los medicamentos varan con frecuencia dependiendo del lugar de dnde se surte la receta y alguna farmacias pueden ofrecer precios ms baratos.  El sitio web www.goodrx.com tiene cupones para medicamentos de diferentes farmacias. Los precios aqu no tienen en cuenta lo que podra costar con la ayuda del seguro (puede ser ms barato con su seguro), pero el sitio web puede darle el precio si no utiliz ningn seguro.  - Puede imprimir el cupn correspondiente y llevarlo con su receta a la farmacia.  - Tambin puede pasar por nuestra oficina durante el horario de atencin regular y recoger una tarjeta de cupones de GoodRx.  - Si necesita que su receta se enve electrnicamente a una farmacia diferente, informe a nuestra oficina a travs de MyChart de [facility]   o por telfono llamando al [phone] y presione la opcin 4.  

## 2022-06-06 NOTE — Telephone Encounter (Signed)
Patient returned called. This is her second treatment.  She does not have a history of cold sores. aw

## 2022-06-17 ENCOUNTER — Encounter: Payer: Self-pay | Admitting: Dermatology

## 2022-06-17 ENCOUNTER — Telehealth: Payer: Self-pay

## 2022-06-17 NOTE — Telephone Encounter (Signed)
Dr. Nehemiah Massed requested follow up call to patient regarding recent BBL procedure. Called patient to check and see how she was doing after procedure and if she was please so far with results. No answer. Lmom for patient to return call to office.

## 2022-08-19 DIAGNOSIS — H02831 Dermatochalasis of right upper eyelid: Secondary | ICD-10-CM | POA: Insufficient documentation

## 2022-09-10 ENCOUNTER — Telehealth: Payer: Self-pay

## 2022-09-10 NOTE — Telephone Encounter (Signed)
LM on VM please call here if she would like a rx for Valtrex called in to her pharmacy for- pt has a laser appt scheduled here tomorrow

## 2022-09-11 ENCOUNTER — Ambulatory Visit: Payer: Medicare Other | Admitting: Dermatology

## 2022-09-11 ENCOUNTER — Ambulatory Visit (INDEPENDENT_AMBULATORY_CARE_PROVIDER_SITE_OTHER): Payer: Self-pay | Admitting: Dermatology

## 2022-09-11 VITALS — BP 122/58

## 2022-09-11 DIAGNOSIS — L578 Other skin changes due to chronic exposure to nonionizing radiation: Secondary | ICD-10-CM

## 2022-09-11 DIAGNOSIS — L719 Rosacea, unspecified: Secondary | ICD-10-CM

## 2022-09-11 DIAGNOSIS — L814 Other melanin hyperpigmentation: Secondary | ICD-10-CM

## 2022-09-12 ENCOUNTER — Ambulatory Visit (INDEPENDENT_AMBULATORY_CARE_PROVIDER_SITE_OTHER): Payer: Self-pay | Admitting: Dermatology

## 2022-09-12 VITALS — BP 114/66 | HR 72

## 2022-09-12 DIAGNOSIS — I8393 Asymptomatic varicose veins of bilateral lower extremities: Secondary | ICD-10-CM

## 2022-09-12 NOTE — Patient Instructions (Signed)
Due to recent changes in healthcare laws, you may see results of your pathology and/or laboratory studies on MyChart before the doctors have had a chance to review them. We understand that in some cases there may be results that are confusing or concerning to you. Please understand that not all results are received at the same time and often the doctors may need to interpret multiple results in order to provide you with the best plan of care or course of treatment. Therefore, we ask that you please give us 2 business days to thoroughly review all your results before contacting the office for clarification. Should we see a critical lab result, you will be contacted sooner.   If You Need Anything After Your Visit  If you have any questions or concerns for your doctor, please call our main line at [phone] and press option 4 to reach your doctor's medical assistant. If no one answers, please leave a voicemail as directed and we will return your call as soon as possible. Messages left after 4 pm will be answered the following business day.   You may also send us a message via MyChart. We typically respond to MyChart messages within 1-2 business days.  For prescription refills, please ask your pharmacy to contact our office. Our fax number is [phone].  If you have an urgent issue when the clinic is closed that cannot wait until the next business day, you can page your doctor at the number below.    Please note that while we do our best to be available for urgent issues outside of office hours, we are not available 24/7.   If you have an urgent issue and are unable to reach us, you may choose to seek medical care at your doctor's office, retail clinic, urgent care center, or emergency room.  If you have a medical emergency, please immediately call 911 or go to the emergency department.  Pager Numbers  - Dr. Kowalski: [phone]  - Dr. Moye: [phone]  - Dr. Stewart:  [phone]  In the event of inclement weather, please call our main line at [phone] for an update on the status of any delays or closures.  Dermatology Medication Tips: Please keep the boxes that topical medications come in in order to help keep track of the instructions about where and how to use these. Pharmacies typically print the medication instructions only on the boxes and not directly on the medication tubes.   If your medication is too expensive, please contact our office at [phone] option 4 or send us a message through MyChart.   We are unable to tell what your co-pay for medications will be in advance as this is different depending on your insurance coverage. However, we may be able to find a substitute medication at lower cost or fill out paperwork to get insurance to cover a needed medication.   If a prior authorization is required to get your medication covered by your insurance company, please allow us 1-2 business days to complete this process.  Drug prices often vary depending on where the prescription is filled and some pharmacies may offer cheaper prices.  The website www.goodrx.com contains coupons for medications through different pharmacies. The prices here do not account for what the cost may be with help from insurance (it may be cheaper with your insurance), but the website can give you the price if you did not use any insurance.  - You can print the associated coupon and take it with   your prescription to the pharmacy.  - You may also stop by our office during regular business hours and pick up a GoodRx coupon card.  - If you need your prescription sent electronically to a different pharmacy, notify our office through [facility] MyChart or by phone at [phone] option 4.     Si Usted Necesita Algo Despus de Su Visita  Tambin puede enviarnos un mensaje a travs de MyChart. Por lo general respondemos a los mensajes de MyChart en el transcurso de 1 a 2  das hbiles.  Para renovar recetas, por favor pida a su farmacia que se ponga en contacto con nuestra oficina. Nuestro nmero de fax es el [phone].  Si tiene un asunto urgente cuando la clnica est cerrada y que no puede esperar hasta el siguiente da hbil, puede llamar/localizar a su doctor(a) al nmero que aparece a continuacin.   Por favor, tenga en cuenta que aunque hacemos todo lo posible para estar disponibles para asuntos urgentes fuera del horario de oficina, no estamos disponibles las 24 horas del da, los 7 das de la semana.   Si tiene un problema urgente y no puede comunicarse con nosotros, puede optar por buscar atencin mdica  en el consultorio de su doctor(a), en una clnica privada, en un centro de atencin urgente o en una sala de emergencias.  Si tiene una emergencia mdica, por favor llame inmediatamente al 911 o vaya a la sala de emergencias.  Nmeros de bper  - Dr. Kowalski: [phone]  - Dra. Moye: [phone]  - Dra. Stewart: [phone]  En caso de inclemencias del tiempo, por favor llame a nuestra lnea principal al [phone] para una actualizacin sobre el estado de cualquier retraso o cierre.  Consejos para la medicacin en dermatologa: Por favor, guarde las cajas en las que vienen los medicamentos de uso tpico para ayudarle a seguir las instrucciones sobre dnde y cmo usarlos. Las farmacias generalmente imprimen las instrucciones del medicamento slo en las cajas y no directamente en los tubos del medicamento.   Si su medicamento es muy caro, por favor, pngase en contacto con nuestra oficina llamando al [phone] y presione la opcin 4 o envenos un mensaje a travs de MyChart.   No podemos decirle cul ser su copago por los medicamentos por adelantado ya que esto es diferente dependiendo de la cobertura de su seguro. Sin embargo, es posible que podamos encontrar un medicamento sustituto a menor costo o llenar un formulario para que el  seguro cubra el medicamento que se considera necesario.   Si se requiere una autorizacin previa para que su compaa de seguros cubra su medicamento, por favor permtanos de 1 a 2 das hbiles para completar este proceso.  Los precios de los medicamentos varan con frecuencia dependiendo del lugar de dnde se surte la receta y alguna farmacias pueden ofrecer precios ms baratos.  El sitio web www.goodrx.com tiene cupones para medicamentos de diferentes farmacias. Los precios aqu no tienen en cuenta lo que podra costar con la ayuda del seguro (puede ser ms barato con su seguro), pero el sitio web puede darle el precio si no utiliz ningn seguro.  - Puede imprimir el cupn correspondiente y llevarlo con su receta a la farmacia.  - Tambin puede pasar por nuestra oficina durante el horario de atencin regular y recoger una tarjeta de cupones de GoodRx.  - Si necesita que su receta se enve electrnicamente a una farmacia diferente, informe a nuestra oficina a travs de MyChart de [place]   o por telfono llamando al [phone] y presione la opcin 4.  

## 2022-09-12 NOTE — Patient Instructions (Signed)
Due to recent changes in healthcare laws, you may see results of your pathology and/or laboratory studies on MyChart before the doctors have had a chance to review them. We understand that in some cases there may be results that are confusing or concerning to you. Please understand that not all results are received at the same time and often the doctors may need to interpret multiple results in order to provide you with the best plan of care or course of treatment. Therefore, we ask that you please give us 2 business days to thoroughly review all your results before contacting the office for clarification. Should we see a critical lab result, you will be contacted sooner.   If You Need Anything After Your Visit  If you have any questions or concerns for your doctor, please call our main line at [phone] and press option 4 to reach your doctor's medical assistant. If no one answers, please leave a voicemail as directed and we will return your call as soon as possible. Messages left after 4 pm will be answered the following business day.   You may also send us a message via MyChart. We typically respond to MyChart messages within 1-2 business days.  For prescription refills, please ask your pharmacy to contact our office. Our fax number is [phone].  If you have an urgent issue when the clinic is closed that cannot wait until the next business day, you can page your doctor at the number below.    Please note that while we do our best to be available for urgent issues outside of office hours, we are not available 24/7.   If you have an urgent issue and are unable to reach us, you may choose to seek medical care at your doctor's office, retail clinic, urgent care center, or emergency room.  If you have a medical emergency, please immediately call 911 or go to the emergency department.  Pager Numbers  - Dr. Kowalski: [phone]  - Dr. Moye: [phone]  - Dr. Stewart:  [phone]  In the event of inclement weather, please call our main line at [phone] for an update on the status of any delays or closures.  Dermatology Medication Tips: Please keep the boxes that topical medications come in in order to help keep track of the instructions about where and how to use these. Pharmacies typically print the medication instructions only on the boxes and not directly on the medication tubes.   If your medication is too expensive, please contact our office at [phone] option 4 or send us a message through MyChart.   We are unable to tell what your co-pay for medications will be in advance as this is different depending on your insurance coverage. However, we may be able to find a substitute medication at lower cost or fill out paperwork to get insurance to cover a needed medication.   If a prior authorization is required to get your medication covered by your insurance company, please allow us 1-2 business days to complete this process.  Drug prices often vary depending on where the prescription is filled and some pharmacies may offer cheaper prices.  The website www.goodrx.com contains coupons for medications through different pharmacies. The prices here do not account for what the cost may be with help from insurance (it may be cheaper with your insurance), but the website can give you the price if you did not use any insurance.  - You can print the associated coupon and take it with   your prescription to the pharmacy.  - You may also stop by our office during regular business hours and pick up a GoodRx coupon card.  - If you need your prescription sent electronically to a different pharmacy, notify our office through [facility] MyChart or by phone at [phone] option 4.     Si Usted Necesita Algo Despus de Su Visita  Tambin puede enviarnos un mensaje a travs de MyChart. Por lo general respondemos a los mensajes de MyChart en el transcurso de 1 a 2  das hbiles.  Para renovar recetas, por favor pida a su farmacia que se ponga en contacto con nuestra oficina. Nuestro nmero de fax es el [phone].  Si tiene un asunto urgente cuando la clnica est cerrada y que no puede esperar hasta el siguiente da hbil, puede llamar/localizar a su doctor(a) al nmero que aparece a continuacin.   Por favor, tenga en cuenta que aunque hacemos todo lo posible para estar disponibles para asuntos urgentes fuera del horario de oficina, no estamos disponibles las 24 horas del da, los 7 das de la semana.   Si tiene un problema urgente y no puede comunicarse con nosotros, puede optar por buscar atencin mdica  en el consultorio de su doctor(a), en una clnica privada, en un centro de atencin urgente o en una sala de emergencias.  Si tiene una emergencia mdica, por favor llame inmediatamente al 911 o vaya a la sala de emergencias.  Nmeros de bper  - Dr. Kowalski: [phone]  - Dra. Moye: [phone]  - Dra. Stewart: [phone]  En caso de inclemencias del tiempo, por favor llame a nuestra lnea principal al [phone] para una actualizacin sobre el estado de cualquier retraso o cierre.  Consejos para la medicacin en dermatologa: Por favor, guarde las cajas en las que vienen los medicamentos de uso tpico para ayudarle a seguir las instrucciones sobre dnde y cmo usarlos. Las farmacias generalmente imprimen las instrucciones del medicamento slo en las cajas y no directamente en los tubos del medicamento.   Si su medicamento es muy caro, por favor, pngase en contacto con nuestra oficina llamando al [phone] y presione la opcin 4 o envenos un mensaje a travs de MyChart.   No podemos decirle cul ser su copago por los medicamentos por adelantado ya que esto es diferente dependiendo de la cobertura de su seguro. Sin embargo, es posible que podamos encontrar un medicamento sustituto a menor costo o llenar un formulario para que el  seguro cubra el medicamento que se considera necesario.   Si se requiere una autorizacin previa para que su compaa de seguros cubra su medicamento, por favor permtanos de 1 a 2 das hbiles para completar este proceso.  Los precios de los medicamentos varan con frecuencia dependiendo del lugar de dnde se surte la receta y alguna farmacias pueden ofrecer precios ms baratos.  El sitio web www.goodrx.com tiene cupones para medicamentos de diferentes farmacias. Los precios aqu no tienen en cuenta lo que podra costar con la ayuda del seguro (puede ser ms barato con su seguro), pero el sitio web puede darle el precio si no utiliz ningn seguro.  - Puede imprimir el cupn correspondiente y llevarlo con su receta a la farmacia.  - Tambin puede pasar por nuestra oficina durante el horario de atencin regular y recoger una tarjeta de cupones de GoodRx.  - Si necesita que su receta se enve electrnicamente a una farmacia diferente, informe a nuestra oficina a travs de MyChart de [place]   o por telfono llamando al [phone] y presione la opcin 4.  

## 2022-09-12 NOTE — Progress Notes (Signed)
   Follow-Up Visit   Subjective  Teresa Cohen is a 77 y.o. female who presents for the following: Rosacea (BBL today).  The following portions of the chart were reviewed this encounter and updated as appropriate:   Allergies  Meds  Problems  Med Hx  Surg Hx  Fam Hx     Review of Systems:  No other skin or systemic complaints except as noted in HPI or Assessment and Plan.  Objective  Well appearing patient in no apparent distress; mood and affect are within normal limits.  A focused examination was performed including face. Relevant physical exam findings are noted in the Assessment and Plan.  Face     Face         Assessment & Plan  Rosacea Face Photorejuvenation - Face Prior to the procedure, the patient's past medical history, medications, allergies, and the rare but potential risks and complications were reviewed with the patient and a signed consent was obtained.  Pre and post treatment care was discussed and instructions provided.   [place] BBL - 09/12/22 1700      Patient Details   Anesthestic Cream Applied: No    Photo Takes: No    Consent Signed: Yes    Improvement from Previous Treatment: Yes      Treatment Details   Date: 09/11/22    Treatment #: 3    Area: Face      1st Pass   Location: F    Device: 560    BBL j/cm2: 28    PW Msec Sec: 26    Cooling Temp: 20    Pulses: 52      2nd Pass   Location: F    Device: 560    BBL j/cm2: 14    PW Msec Sec: 10    Cooling Temp: 25    Pulses: 135     Patient tolerated the procedure well.   Nancy Fetter avoidance was stressed. The patient will call with any problems, questions or concerns prior to their next appointment.  Actinic skin damage and Lentigos Face BBL performed today See photos of settings. Fluence 14 Width 10 Pulses 135  Skin type 2 Light color pigment Large crystal 15x15 mm crystal 515 filter Return for Follow up as scheduled.  I, Ashok Cordia, CMA, am acting as scribe for Sarina Ser, MD . Documentation: I have reviewed the above documentation for accuracy and completeness, and I agree with the above.  Sarina Ser, MD

## 2022-09-12 NOTE — Progress Notes (Signed)
   Follow-Up Visit   Subjective  Teresa Cohen is a 77 y.o. female who presents for the following: Varicose Veins. Patient here for sclerotherapy to help with  unwanted veins on her legs.   The following portions of the chart were reviewed this encounter and updated as appropriate:   Allergies  Meds  Problems  Med Hx  Surg Hx  Fam Hx     Review of Systems:  No other skin or systemic complaints except as noted in HPI or Assessment and Plan.  Objective  Well appearing patient in no apparent distress; mood and affect are within normal limits.  A focused examination was performed including bilateral legs. Relevant physical exam findings are noted in the Assessment and Plan.  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 Larger vessels may be covered if symptomatic and we would refer to vascular surgeon if treatment desired.   Intralesional injection - bilateral legs The patient presents for desired sclerotherapy for desired treatment of desired treatment of small to medium varicosities of the bilateral legs.  Procedure: The patient was counseled and understands about the effects, side effects and potential risks and complications of the sclerotherapy procedure. The patient was given the opportunity to ask questions. Asclera (polidocanol) 1% (total 1 cc) was injected into the varices. In order to ensure correct placement of the catheter in the vein, I drew back slightly to give moderate blood show in the syringe. If there was any evidence or suspicion of extravasation of sclerosant, the area was immediately diluted with a large volume of  0.9% saline. A pressure dressing was applied immediately to the injected sites. The patient tolerated the procedure well without complication. The patient was instructed in post-operative compression stocking use. The patient understands to call or return immediately if any problems noted.  Return if symptoms worsen or fail to improve.  IMarye Round, CMA, am acting as scribe for Sarina Ser, MD .  Documentation: I have reviewed the above documentation for accuracy and completeness, and I agree with the above.  Sarina Ser, MD

## 2022-09-13 ENCOUNTER — Encounter: Payer: Self-pay | Admitting: Dermatology

## 2022-09-14 ENCOUNTER — Encounter: Payer: Self-pay | Admitting: Dermatology

## 2022-10-03 ENCOUNTER — Ambulatory Visit (INDEPENDENT_AMBULATORY_CARE_PROVIDER_SITE_OTHER): Payer: Self-pay | Admitting: Dermatology

## 2022-10-03 DIAGNOSIS — L988 Other specified disorders of the skin and subcutaneous tissue: Secondary | ICD-10-CM

## 2022-10-03 NOTE — Progress Notes (Signed)
   Follow-Up Visit   Subjective  Teresa Cohen is a 77 y.o. female who presents for the following: Botox for facial elastosis. Patient had sclerotherapy three weeks ago and is concerned about the appearance, tenderness, and pain at some sites. The following portions of the chart were reviewed this encounter and updated as appropriate: medications, allergies, medical history  Review of Systems:  No other skin or systemic complaints except as noted in HPI or Assessment and Plan.  Objective  Well appearing patient in no apparent distress; mood and affect are within normal limits.  A focused examination was performed of the face.  Relevant physical exam findings are noted in the Assessment and Plan.                       Assessment & Plan    Facial Elastosis  Location: See attached image  Informed consent: Discussed risks (infection, pain, bleeding, bruising, swelling, allergic reaction, paralysis of nearby muscles, eyelid droop, double vision, neck weakness, difficulty breathing, headache, undesirable cosmetic result, and need for additional treatment) and benefits of the procedure, as well as the alternatives.  Informed consent was obtained.  Preparation: The area was cleansed with alcohol.  Procedure Details:  Botox was injected into the dermis with a 30-gauge needle. Pressure applied to any bleeding. Ice packs offered for swelling.  Lot Number:  [id number] Expiration:  03/26  Total Units Injected:  25  Plan: Tylenol may be used for headache.  Allow 2 weeks before returning to clinic for additional dosing as needed. Patient will call for any problems.  Botox 25 units injected as marked into the frown complex.  Discussed filler to the oral commissure and nasolabial folds. She would like to inject Botox today and schedule filler for a later date.   Varicose Veins/Spider Veins - S/P sclerotherapy, discussed with patient that veins appear normal post treatment, and  we wouldn't be concerned about the appearance unless veins and discoloration haven't improved by 3-4 months  - Dilated blue, purple or red veins at the lower extremities - Reassured - Smaller vessels can be treated by sclerotherapy (a procedure to inject a medicine into the veins to make them disappear) if desired, but the treatment is not covered by insurance. Larger vessels may be covered if symptomatic and we would refer to vascular surgeon if treatment desired.     Right posterior medial thigh S/P sclerotherapy 3 weeks ago  Return in about 1 month (around 11/02/2022) for Botox follow up .  Maylene Roes, CMA, am acting as scribe for Armida Sans, MD .  Documentation: I have reviewed the above documentation for accuracy and completeness, and I agree with the above.  Armida Sans, MD

## 2022-10-03 NOTE — Patient Instructions (Addendum)
Due to recent changes in healthcare laws, you may see results of your pathology and/or laboratory studies on MyChart before the doctors have had a chance to review them. We understand that in some cases there may be results that are confusing or concerning to you. Please understand that not all results are received at the same time and often the doctors may need to interpret multiple results in order to provide you with the best plan of care or course of treatment. Therefore, we ask that you please give us 2 business days to thoroughly review all your results before contacting the office for clarification. Should we see a critical lab result, you will be contacted sooner.   If You Need Anything After Your Visit  If you have any questions or concerns for your doctor, please call our main line at [phone] and press option 4 to reach your doctor's medical assistant. If no one answers, please leave a voicemail as directed and we will return your call as soon as possible. Messages left after 4 pm will be answered the following business day.   You may also send us a message via MyChart. We typically respond to MyChart messages within 1-2 business days.  For prescription refills, please ask your pharmacy to contact our office. Our fax number is [phone].  If you have an urgent issue when the clinic is closed that cannot wait until the next business day, you can page your doctor at the number below.    Please note that while we do our best to be available for urgent issues outside of office hours, we are not available 24/7.   If you have an urgent issue and are unable to reach us, you may choose to seek medical care at your doctor's office, retail clinic, urgent care center, or emergency room.  If you have a medical emergency, please immediately call 911 or go to the emergency department.  Pager Numbers  - Dr. Kowalski: [phone]  - Dr. Moye: [phone]  - Dr. Stewart:  [phone]  In the event of inclement weather, please call our main line at [phone] for an update on the status of any delays or closures.  Dermatology Medication Tips: Please keep the boxes that topical medications come in in order to help keep track of the instructions about where and how to use these. Pharmacies typically print the medication instructions only on the boxes and not directly on the medication tubes.   If your medication is too expensive, please contact our office at [phone] option 4 or send us a message through MyChart.   We are unable to tell what your co-pay for medications will be in advance as this is different depending on your insurance coverage. However, we may be able to find a substitute medication at lower cost or fill out paperwork to get insurance to cover a needed medication.   If a prior authorization is required to get your medication covered by your insurance company, please allow us 1-2 business days to complete this process.  Drug prices often vary depending on where the prescription is filled and some pharmacies may offer cheaper prices.  The website www.goodrx.com contains coupons for medications through different pharmacies. The prices here do not account for what the cost may be with help from insurance (it may be cheaper with your insurance), but the website can give you the price if you did not use any insurance.  - You can print the associated coupon and take it with   your prescription to the pharmacy.  - You may also stop by our office during regular business hours and pick up a GoodRx coupon card.  - If you need your prescription sent electronically to a different pharmacy, notify our office through [facility] MyChart or by phone at [phone] option 4.     Si Usted Necesita Algo Despus de Su Visita  Tambin puede enviarnos un mensaje a travs de MyChart. Por lo general respondemos a los mensajes de MyChart en el transcurso de 1 a 2  das hbiles.  Para renovar recetas, por favor pida a su farmacia que se ponga en contacto con nuestra oficina. Nuestro nmero de fax es el [phone].  Si tiene un asunto urgente cuando la clnica est cerrada y que no puede esperar hasta el siguiente da hbil, puede llamar/localizar a su doctor(a) al nmero que aparece a continuacin.   Por favor, tenga en cuenta que aunque hacemos todo lo posible para estar disponibles para asuntos urgentes fuera del horario de oficina, no estamos disponibles las 24 horas del da, los 7 das de la semana.   Si tiene un problema urgente y no puede comunicarse con nosotros, puede optar por buscar atencin mdica  en el consultorio de su doctor(a), en una clnica privada, en un centro de atencin urgente o en una sala de emergencias.  Si tiene una emergencia mdica, por favor llame inmediatamente al 911 o vaya a la sala de emergencias.  Nmeros de bper  - Dr. Kowalski: [phone]  - Dra. Moye: [phone]  - Dra. Stewart: [phone]  En caso de inclemencias del tiempo, por favor llame a nuestra lnea principal al [phone] para una actualizacin sobre el estado de cualquier retraso o cierre.  Consejos para la medicacin en dermatologa: Por favor, guarde las cajas en las que vienen los medicamentos de uso tpico para ayudarle a seguir las instrucciones sobre dnde y cmo usarlos. Las farmacias generalmente imprimen las instrucciones del medicamento slo en las cajas y no directamente en los tubos del medicamento.   Si su medicamento es muy caro, por favor, pngase en contacto con nuestra oficina llamando al [phone] y presione la opcin 4 o envenos un mensaje a travs de MyChart.   No podemos decirle cul ser su copago por los medicamentos por adelantado ya que esto es diferente dependiendo de la cobertura de su seguro. Sin embargo, es posible que podamos encontrar un medicamento sustituto a menor costo o llenar un formulario para que el  seguro cubra el medicamento que se considera necesario.   Si se requiere una autorizacin previa para que su compaa de seguros cubra su medicamento, por favor permtanos de 1 a 2 das hbiles para completar este proceso.  Los precios de los medicamentos varan con frecuencia dependiendo del lugar de dnde se surte la receta y alguna farmacias pueden ofrecer precios ms baratos.  El sitio web www.goodrx.com tiene cupones para medicamentos de diferentes farmacias. Los precios aqu no tienen en cuenta lo que podra costar con la ayuda del seguro (puede ser ms barato con su seguro), pero el sitio web puede darle el precio si no utiliz ningn seguro.  - Puede imprimir el cupn correspondiente y llevarlo con su receta a la farmacia.  - Tambin puede pasar por nuestra oficina durante el horario de atencin regular y recoger una tarjeta de cupones de GoodRx.  - Si necesita que su receta se enve electrnicamente a una farmacia diferente, informe a nuestra oficina a travs de MyChart de [place]   o por telfono llamando al [phone] y presione la opcin 4.  

## 2022-10-13 ENCOUNTER — Encounter: Payer: Self-pay | Admitting: Dermatology

## 2022-11-05 ENCOUNTER — Ambulatory Visit: Payer: Medicare Other | Admitting: Dermatology

## 2022-11-28 ENCOUNTER — Ambulatory Visit (INDEPENDENT_AMBULATORY_CARE_PROVIDER_SITE_OTHER): Payer: Medicare Other | Admitting: Family Medicine

## 2022-11-28 ENCOUNTER — Encounter: Payer: Self-pay | Admitting: Family Medicine

## 2022-11-28 VITALS — BP 132/78 | HR 60 | Ht 61.5 in | Wt 142.0 lb

## 2022-11-28 DIAGNOSIS — E538 Deficiency of other specified B group vitamins: Secondary | ICD-10-CM

## 2022-11-28 DIAGNOSIS — R7309 Other abnormal glucose: Secondary | ICD-10-CM

## 2022-11-28 DIAGNOSIS — I10 Essential (primary) hypertension: Secondary | ICD-10-CM

## 2022-11-28 DIAGNOSIS — Z1211 Encounter for screening for malignant neoplasm of colon: Secondary | ICD-10-CM

## 2022-11-28 DIAGNOSIS — Z780 Asymptomatic menopausal state: Secondary | ICD-10-CM | POA: Diagnosis not present

## 2022-11-28 DIAGNOSIS — Z7689 Persons encountering health services in other specified circumstances: Secondary | ICD-10-CM

## 2022-11-28 DIAGNOSIS — E785 Hyperlipidemia, unspecified: Secondary | ICD-10-CM

## 2022-11-28 DIAGNOSIS — E559 Vitamin D deficiency, unspecified: Secondary | ICD-10-CM

## 2022-11-28 DIAGNOSIS — Z1231 Encounter for screening mammogram for malignant neoplasm of breast: Secondary | ICD-10-CM

## 2022-11-28 DIAGNOSIS — Z1159 Encounter for screening for other viral diseases: Secondary | ICD-10-CM

## 2022-11-28 NOTE — Progress Notes (Signed)
SUBJECTIVE:   Chief Complaint  Patient presents with   Establish Care   Hot Flashes   HPI Patient presents to clinic to establish care.  Endorses recently had annual physical with PCP couple of months ago.  Lab work up-to-date.  Moved from [place] area to [place] area 3 years ago.    Primarily followed by current medical team in [place].  Plans to transition to [facility] area in future. PCP-Dr. Jake Church OB/GYN-Dr. Annamarie Dawley GI-Dr. Trisha Mangle  No acute concerns today.  Hypertension Currently on amlodipine 5 mg daily and tolerating well.  Denies any chest pain, shortness of breath, heart palpitations or lower extremity edema.  Hyperlipidemia Takes rosuvastatin daily.  Unsure dosing.  Follows with  OBGYN in [place] for q 6 month PAP Endorses an upcoming pelvic u/s that she thinks may be for evaluation of diverticulitis.    Last Colonoscopy 2021.  Normal.  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  Has been following with GI in [place]  PERTINENT PMH / PSH: Hypertension Hyperlipidemia Diverticulitis Shoulder pain Radiculopathy Right-sided sciatica Degenerative disc disease  OBJECTIVE:  BP 132/78   Pulse 60   Ht 5' 1.5" (1.562 m)   Wt 142 lb (64.4 kg)   SpO2 98%   BMI 26.40 kg/m    Physical Exam Vitals reviewed.  Constitutional:      General: She is not in acute distress.    Appearance: She is not ill-appearing.  HENT:     Head: Normocephalic.     Right Ear: Tympanic membrane, ear canal and external ear normal.     Left Ear: Tympanic membrane, ear canal and external ear normal.     Nose: Nose normal.     Mouth/Throat:     Mouth: Mucous membranes are moist.  Eyes:     Extraocular Movements: Extraocular movements intact.     Conjunctiva/sclera: Conjunctivae normal.     Pupils: Pupils are equal, round, and reactive to light.  Neck:     Thyroid: No thyromegaly or thyroid tenderness.     Vascular: No carotid bruit.   Cardiovascular:     Rate and Rhythm: Normal rate and regular rhythm.     Pulses: Normal pulses.     Heart sounds: Normal heart sounds.  Pulmonary:     Effort: Pulmonary effort is normal.     Breath sounds: Normal breath sounds.  Abdominal:     General: Bowel sounds are normal. There is no distension.     Palpations: Abdomen is soft.     Tenderness: There is no abdominal tenderness. There is no right CVA tenderness, left CVA tenderness, guarding or rebound.  Musculoskeletal:        General: Normal range of motion.     Cervical back: Normal range of motion.     Right lower leg: No edema.     Left lower leg: No edema.  Lymphadenopathy:     Cervical: No cervical adenopathy.  Skin:    Capillary Refill: Capillary refill takes less than 2 seconds.  Neurological:     General: No focal deficit present.     Mental Status: She is alert and oriented to person, place, and time. Mental status is at baseline.     Motor: No weakness.  Psychiatric:        Mood and Affect: Mood normal.        Behavior: Behavior normal.        Thought Content: Thought content  normal.        Judgment: Judgment normal.     ASSESSMENT/PLAN:  Primary hypertension Assessment & Plan: Chronic.  Well-controlled on current medications. Continue amlodipine 5 mg daily Maintain blood pressure less than 150/90 per JNC 8 guidelines for age Request recent labs from previous PCP. Follow-up in 6 months  Orders: -     CBC with Differential/Platelet; Future -     Comprehensive metabolic panel; Future  Hyperlipidemia, unspecified hyperlipidemia type Assessment & Plan: Chronic.  On statin therapy and tolerating well.  No myalgias. Continue rosuvastatin daily.  Patient to call in dose of medication. Request recent labs Follow-up in 6 months  Orders: -     Lipid panel; Future  Breast cancer screening by mammogram -     3D Screening Mammogram, Left and Right; Future  Postmenopausal estrogen deficiency -     DG Bone  Density; Future  Abnormal glucose -     Hemoglobin A1c; Future  Vitamin B 12 deficiency -     Vitamin B12; Future  Vitamin D deficiency -     VITAMIN D 25 Hydroxy (Vit-D Deficiency, Fractures); Future  Need for hepatitis C screening test -     Hepatitis C antibody; Future  Colon cancer screening -     Ambulatory referral to Gastroenterology   PDMP reviewed  Return in about 6 months (around 05/31/2023).  Dana Allan, MD

## 2022-11-28 NOTE — Patient Instructions (Addendum)
It was a pleasure meeting you today. Thank you for allowing me to take part in your health care.  Our goals for today as we discussed include:  Follow up in 6 months  Please send me your dose of cholesterol medication  Please send your immunization records   If you have any questions or concerns, please do not hesitate to call the office at [phone].  I look forward to our next visit and until then take care and stay safe.  Regards,   Dana Allan, MD   [facility]

## 2022-12-06 ENCOUNTER — Encounter: Payer: Self-pay | Admitting: Family Medicine

## 2022-12-06 DIAGNOSIS — E538 Deficiency of other specified B group vitamins: Secondary | ICD-10-CM | POA: Insufficient documentation

## 2022-12-06 DIAGNOSIS — E785 Hyperlipidemia, unspecified: Secondary | ICD-10-CM | POA: Insufficient documentation

## 2022-12-06 DIAGNOSIS — R7309 Other abnormal glucose: Secondary | ICD-10-CM | POA: Insufficient documentation

## 2022-12-06 DIAGNOSIS — E559 Vitamin D deficiency, unspecified: Secondary | ICD-10-CM | POA: Insufficient documentation

## 2022-12-06 DIAGNOSIS — I10 Essential (primary) hypertension: Secondary | ICD-10-CM | POA: Insufficient documentation

## 2022-12-06 DIAGNOSIS — Z1211 Encounter for screening for malignant neoplasm of colon: Secondary | ICD-10-CM | POA: Insufficient documentation

## 2022-12-06 DIAGNOSIS — Z1231 Encounter for screening mammogram for malignant neoplasm of breast: Secondary | ICD-10-CM | POA: Insufficient documentation

## 2022-12-06 DIAGNOSIS — Z1159 Encounter for screening for other viral diseases: Secondary | ICD-10-CM | POA: Insufficient documentation

## 2022-12-06 DIAGNOSIS — Z780 Asymptomatic menopausal state: Secondary | ICD-10-CM | POA: Insufficient documentation

## 2022-12-06 NOTE — Assessment & Plan Note (Addendum)
Chronic.  Well-controlled on current medications. Continue amlodipine 5 mg daily Maintain blood pressure less than 150/90 per JNC 8 guidelines for age Request recent labs from previous PCP. Follow-up in 6 months

## 2022-12-06 NOTE — Assessment & Plan Note (Addendum)
Chronic.  On statin therapy and tolerating well.  No myalgias. Continue rosuvastatin daily.  Patient to call in dose of medication. Request recent labs Follow-up in 6 months

## 2022-12-19 ENCOUNTER — Encounter: Payer: Self-pay | Admitting: *Deleted

## 2023-02-12 ENCOUNTER — Telehealth: Payer: Self-pay

## 2023-02-12 NOTE — Telephone Encounter (Signed)
Left pt message asking if she could come tomorrow 02/13/23 at 12:00 instead of tomorrow at 2:00.  Asked patient to call back and let us know if this works for her./sh

## 2023-02-13 ENCOUNTER — Ambulatory Visit (INDEPENDENT_AMBULATORY_CARE_PROVIDER_SITE_OTHER): Payer: Medicare Other | Admitting: Dermatology

## 2023-02-13 DIAGNOSIS — L570 Actinic keratosis: Secondary | ICD-10-CM

## 2023-02-13 DIAGNOSIS — R21 Rash and other nonspecific skin eruption: Secondary | ICD-10-CM

## 2023-02-13 DIAGNOSIS — D229 Melanocytic nevi, unspecified: Secondary | ICD-10-CM

## 2023-02-13 DIAGNOSIS — L578 Other skin changes due to chronic exposure to nonionizing radiation: Secondary | ICD-10-CM

## 2023-02-13 DIAGNOSIS — L814 Other melanin hyperpigmentation: Secondary | ICD-10-CM

## 2023-02-13 DIAGNOSIS — L259 Unspecified contact dermatitis, unspecified cause: Secondary | ICD-10-CM

## 2023-02-13 DIAGNOSIS — L821 Other seborrheic keratosis: Secondary | ICD-10-CM

## 2023-02-13 DIAGNOSIS — D1801 Hemangioma of skin and subcutaneous tissue: Secondary | ICD-10-CM

## 2023-02-13 DIAGNOSIS — Z7189 Other specified counseling: Secondary | ICD-10-CM

## 2023-02-13 DIAGNOSIS — W908XXA Exposure to other nonionizing radiation, initial encounter: Secondary | ICD-10-CM | POA: Diagnosis not present

## 2023-02-13 DIAGNOSIS — Z1283 Encounter for screening for malignant neoplasm of skin: Secondary | ICD-10-CM

## 2023-02-13 NOTE — Progress Notes (Signed)
Follow-Up Visit   Subjective  Teresa Cohen is a 77 y.o. female who presents for the following: Skin Cancer Screening and Full Body Skin Exam. Patient c/o a rash on her face and neck treated neck with Mometasone cream with a fair response, patient did not use mometasone on her face.   The patient presents for Total-Body Skin Exam (TBSE) for skin cancer screening and mole check. The patient has spots, moles and lesions to be evaluated, some may be new or changing and the patient may have concern these could be cancer.    The following portions of the chart were reviewed this encounter and updated as appropriate: medications, allergies, medical history  Review of Systems:  No other skin or systemic complaints except as noted in HPI or Assessment and Plan.  Objective  Well appearing patient in no apparent distress; mood and affect are within normal limits.  A full examination was performed including scalp, head, eyes, ears, nose, lips, neck, chest, axillae, abdomen, back, buttocks, bilateral upper extremities, bilateral lower extremities, hands, feet, fingers, toes, fingernails, and toenails. All findings within normal limits unless otherwise noted below.   Relevant physical exam findings are noted in the Assessment and Plan.  face x 2 (2) Erythematous thin papules/macules with gritty scale.     Assessment & Plan   SKIN CANCER SCREENING PERFORMED TODAY.  ACTINIC DAMAGE - Chronic condition, secondary to cumulative UV/sun exposure - diffuse scaly erythematous macules with underlying dyspigmentation - Recommend daily broad spectrum sunscreen SPF 30+ to sun-exposed areas, reapply every 2 hours as needed.  - Staying in the shade or wearing long sleeves, sun glasses (UVA+UVB protection) and wide brim hats (4-inch brim around the entire circumference of the hat) are also recommended for sun protection.  - Call for new or changing lesions.  LENTIGINES, SEBORRHEIC KERATOSES, HEMANGIOMAS -  Benign normal skin lesions - Benign-appearing - Call for any changes  MELANOCYTIC NEVI - Tan-brown and/or pink-flesh-colored symmetric macules and papules - Benign appearing on exam today - Observation - Call clinic for new or changing moles - Recommend daily use of broad spectrum spf 30+ sunscreen to sun-exposed areas.   Rash Face and neck  Exam: dry patches    Differential diagnosis:  Suspect Contact dermatitis   Treatment Plan: Increase Mometasone cream apply to face and neck twice a day for 2 weeks then stop    Stop all facial products until clear   May consider Elidel cream if no better, patient instructed to call and let us know.   May consider patch testing in the future   AK (actinic keratosis) (2) face x 2  Actinic keratoses are precancerous spots that appear secondary to cumulative UV radiation exposure/sun exposure over time. They are chronic with expected duration over 1 year. A portion of actinic keratoses will progress to squamous cell carcinoma of the skin. It is not possible to reliably predict which spots will progress to skin cancer and so treatment is recommended to prevent development of skin cancer.  Recommend daily broad spectrum sunscreen SPF 30+ to sun-exposed areas, reapply every 2 hours as needed.  Recommend staying in the shade or wearing long sleeves, sun glasses (UVA+UVB protection) and wide brim hats (4-inch brim around the entire circumference of the hat). Call for new or changing lesions.   Destruction of lesion - face x 2 (2) Complexity: simple   Destruction method: cryotherapy   Informed consent: discussed and consent obtained   Timeout:  patient name, date of birth,  surgical site, and procedure verified Lesion destroyed using liquid nitrogen: Yes   Region frozen until ice ball extended beyond lesion: Yes   Outcome: patient tolerated procedure well with no complications   Post-procedure details: wound care instructions given     Return  in about 1 year (around 02/13/2024) for TBSE.  IAngelique Holm, CMA, am acting as scribe for Armida Sans, MD .   Documentation: I have reviewed the above documentation for accuracy and completeness, and I agree with the above.  Armida Sans, MD

## 2023-02-13 NOTE — Patient Instructions (Addendum)

## 2023-02-21 ENCOUNTER — Telehealth: Payer: Self-pay | Admitting: Family Medicine

## 2023-02-21 NOTE — Telephone Encounter (Signed)
Copied from CRM [id number]. Topic: Medicare AWV >> Feb 21, 2023  1:44 PM Payton Doughty wrote: Reason for CRM: LM 02/21/2023 to schedule AWV   Verlee Rossetti; Care Guide Ambulatory Clinical Support [place] [facility] Direct Dial: [phone]

## 2023-02-23 ENCOUNTER — Encounter: Payer: Self-pay | Admitting: Dermatology

## 2023-03-27 ENCOUNTER — Ambulatory Visit: Payer: Medicare Other | Admitting: Dermatology

## 2023-04-18 ENCOUNTER — Telehealth: Payer: Self-pay | Admitting: Family Medicine

## 2023-04-18 NOTE — Telephone Encounter (Signed)
Copied from CRM [id number]. Topic: Medicare AWV >> Apr 18, 2023  1:39 PM Payton Doughty wrote: Reason for CRM: Called LVM 04/18/2023 to schedule Annual Wellness Visit  Verlee Rossetti; Care Guide Ambulatory Clinical Support [place] [facility] Direct Dial: [phone]

## 2023-05-22 ENCOUNTER — Ambulatory Visit (INDEPENDENT_AMBULATORY_CARE_PROVIDER_SITE_OTHER): Payer: Medicare Other | Admitting: Dermatology

## 2023-05-22 DIAGNOSIS — I781 Nevus, non-neoplastic: Secondary | ICD-10-CM

## 2023-05-22 DIAGNOSIS — W908XXA Exposure to other nonionizing radiation, initial encounter: Secondary | ICD-10-CM | POA: Diagnosis not present

## 2023-05-22 DIAGNOSIS — L814 Other melanin hyperpigmentation: Secondary | ICD-10-CM

## 2023-05-22 DIAGNOSIS — L570 Actinic keratosis: Secondary | ICD-10-CM

## 2023-05-22 DIAGNOSIS — L578 Other skin changes due to chronic exposure to nonionizing radiation: Secondary | ICD-10-CM | POA: Diagnosis not present

## 2023-05-22 DIAGNOSIS — L719 Rosacea, unspecified: Secondary | ICD-10-CM

## 2023-05-22 NOTE — Patient Instructions (Addendum)
Actinic keratoses are precancerous spots that appear secondary to cumulative UV radiation exposure/sun exposure over time. They are chronic with expected duration over 1 year. A portion of actinic keratoses will progress to squamous cell carcinoma of the skin. It is not possible to reliably predict which spots will progress to skin cancer and so treatment is recommended to prevent development of skin cancer.  Recommend daily broad spectrum sunscreen SPF 30+ to sun-exposed areas, reapply every 2 hours as needed.  Recommend staying in the shade or wearing long sleeves, sun glasses (UVA+UVB protection) and wide brim hats (4-inch brim around the entire circumference of the hat). Call for new or changing lesions.   Cryotherapy Aftercare  Wash gently with soap and water everyday.   Apply Vaseline and Band-Aid daily until healed.     Due to recent changes in healthcare laws, you may see results of your pathology and/or laboratory studies on MyChart before the doctors have had a chance to review them. We understand that in some cases there may be results that are confusing or concerning to you. Please understand that not all results are received at the same time and often the doctors may need to interpret multiple results in order to provide you with the best plan of care or course of treatment. Therefore, we ask that you please give [place] 2 business days to thoroughly review all your results before contacting the office for clarification. Should we see a critical lab result, you will be contacted sooner.   If You Need Anything After Your Visit  If you have any questions or concerns for your doctor, please call our main line at [phone] and press option 4 to reach your doctor's medical assistant. If no one answers, please leave a voicemail as directed and we will return your call as soon as possible. Messages left after 4 pm will be answered the following business day.   You may also send [place] a message  via MyChart. We typically respond to MyChart messages within 1-2 business days.  For prescription refills, please ask your pharmacy to contact our office. Our fax number is [phone].  If you have an urgent issue when the clinic is closed that cannot wait until the next business day, you can page your doctor at the number below.    Please note that while we do our best to be available for urgent issues outside of office hours, we are not available 24/7.   If you have an urgent issue and are unable to reach [place], you may choose to seek medical care at your doctor's office, retail clinic, urgent care center, or emergency room.  If you have a medical emergency, please immediately call 911 or go to the emergency department.  Pager Numbers  - Dr. Gwen Pounds: [phone]  - Dr. Roseanne Reno: [phone]  - Dr. Katrinka Blazing: [phone]   In the event of inclement weather, please call our main line at [phone] for an update on the status of any delays or closures.  Dermatology Medication Tips: Please keep the boxes that topical medications come in in order to help keep track of the instructions about where and how to use these. Pharmacies typically print the medication instructions only on the boxes and not directly on the medication tubes.   If your medication is too expensive, please contact our office at [phone] option 4 or send [place] a message through MyChart.   We are unable to tell what your co-pay for medications will be in advance as  this is different depending on your insurance coverage. However, we may be able to find a substitute medication at lower cost or fill out paperwork to get insurance to cover a needed medication.   If a prior authorization is required to get your medication covered by your insurance company, please allow [place] 1-2 business days to complete this process.  Drug prices often vary depending on where the prescription is filled and some pharmacies may offer cheaper  prices.  The website www.goodrx.com contains coupons for medications through different pharmacies. The prices here do not account for what the cost may be with help from insurance (it may be cheaper with your insurance), but the website can give you the price if you did not use any insurance.  - You can print the associated coupon and take it with your prescription to the pharmacy.  - You may also stop by our office during regular business hours and pick up a GoodRx coupon card.  - If you need your prescription sent electronically to a different pharmacy, notify our office through [facility] or by phone at [phone] option 4.     Si Usted Necesita Algo Despus de Su Visita  Tambin puede enviarnos un mensaje a travs de Clinical cytogeneticist. Por lo general respondemos a los mensajes de MyChart en el transcurso de 1 a 2 das hbiles.  Para renovar recetas, por favor pida a su farmacia que se ponga en contacto con nuestra oficina. Annie Sable de fax es Bernice [phone].  Si tiene un asunto urgente cuando la clnica est cerrada y que no puede esperar hasta el siguiente da hbil, puede llamar/localizar a su doctor(a) al nmero que aparece a continuacin.   Por favor, tenga en cuenta que aunque hacemos todo lo posible para estar disponibles para asuntos urgentes fuera del horario de [place], no estamos disponibles las 24 horas del da, los 7 [address]  [address] de la [place].   Si tiene un problema urgente y no puede comunicarse con nosotros, puede optar por buscar atencin mdica  en el consultorio de su doctor(a), en una clnica privada, en un centro de atencin urgente o en una sala de emergencias.  Si tiene Engineer, drilling, por favor llame inmediatamente al 911 o vaya a la sala de emergencias.  Nmeros de bper  - Dr. Gwen Pounds: [phone]  - Dra. Roseanne Reno: [phone]  - Dr. Katrinka Blazing: [phone]   En caso de inclemencias del tiempo, por favor llame a Lacy Duverney principal al [phone]  para una actualizacin sobre el [place] de cualquier retraso o cierre.  Consejos para la medicacin en dermatologa: Por favor, guarde las cajas en las que vienen los medicamentos de uso tpico para ayudarle a seguir las instrucciones sobre dnde y cmo usarlos. Las farmacias generalmente imprimen las instrucciones del medicamento slo en las cajas y no directamente en los tubos del [place].   Si su medicamento es muy caro, por favor, pngase en contacto con Rolm Gala llamando al [phone] y presione la opcin 4 o envenos un mensaje a travs de Clinical cytogeneticist.   No podemos decirle cul ser su copago por los medicamentos por adelantado ya que esto es diferente dependiendo de la cobertura de su seguro. Sin embargo, es posible que podamos encontrar un medicamento sustituto a Audiological scientist un formulario para que el seguro cubra el medicamento que se considera necesario.   Si se requiere una autorizacin previa para que su compaa de seguros [place] su medicamento, por favor permtanos de 1 a 2 [address]  [address]  hbiles para completar este proceso.  Los precios de los medicamentos varan con frecuencia dependiendo del Environmental consultant de dnde se surte la receta y alguna farmacias pueden ofrecer precios ms baratos.  El sitio web www.goodrx.com tiene cupones para medicamentos de Health and safety inspector. Los precios aqu no tienen en cuenta lo que podra costar con la ayuda del seguro (puede ser ms barato con su seguro), pero el sitio web puede darle el precio si no utiliz Tourist information centre manager.  - Puede imprimir el cupn correspondiente y llevarlo con su receta a la farmacia.  - Tambin puede pasar por nuestra oficina durante el horario de atencin regular y Education officer, museum una tarjeta de cupones de GoodRx.  - Si necesita que su receta se enve electrnicamente a una farmacia diferente, informe a nuestra oficina a travs de MyChart de [place] o por telfono llamando al [phone] y presione la opcin 4.

## 2023-05-22 NOTE — Progress Notes (Unsigned)
Follow-Up Visit   Subjective  Teresa Cohen is a 77 y.o. female who presents for the following: patient here today for BBL treatment for roscea at face, noticed a rough spots at right side nose and rough areas on hands   The patient has spots, moles and lesions to be evaluated, some may be new or changing and the patient may have concern these could be cancer.   The following portions of the chart were reviewed this encounter and updated as appropriate: medications, allergies, medical history  Review of Systems:  No other skin or systemic complaints except as noted in HPI or Assessment and Plan.  Objective  Well appearing patient in no apparent distress; mood and affect are within normal limits.   A focused examination was performed of the following areas: Face, hands, right nasal bridge   Relevant exam findings are noted in the Assessment and Plan.              right nasal bridge x 1 Erythematous thin papules/macules with gritty scale.     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 Condition is symptomatic/ bothersome to patient. Not currently at goal.   Rosacea is a chronic progressive skin condition usually affecting the face of adults, causing redness and/or acne bumps. It is treatable but not curable. It sometimes affects the eyes (ocular rosacea) as well. It may respond to topical and/or systemic medication and can flare with stress, sun exposure, alcohol, exercise, topical steroids (including hydrocortisone/cortisone 10) and some foods.  Daily application of broad spectrum spf 30+ sunscreen to face is recommended to reduce flares.  Treatment Plan Treated with BBL today at cheeks  Patient tolerated the procedure well.   Wynelle Link avoidance was stressed. The patient will call with any problems, questions or concerns prior to their next appointment.   [place]  BBL - 05/22/23 1700      Patient Details   Current Skin Care: face    Skin Type: II    Anesthestic Cream Applied: No    Photo Takes: Yes    Consent Signed: Yes    Improvement from Previous Treatment: Yes      Treatment Details   Date: 05/22/23    Treatment #: 3    Area: face    Filter: 1st Pass;2nd Pass;3rd Pass      1st Pass   Location: F   mid face and nose Reds   Device: 560    BBL j/cm2: 28    PW Msec Sec: 26    Cooling Temp: 20    Target Temp: 3    Pulses: 46    7mm: this one     ACTINIC SKIN DAMAGE AND LENTIGINES Exam: scattered tan macules on face and hands  Treatment Plan: Treated today with BBL at forehead, cheeks, nose, and chin  Patient tolerated the procedure well.   Wynelle Link avoidance was stressed. The patient will call with any problems, questions or concerns prior to their next appointment.   2nd Pass   Location: F   Browns at face   Device: 515    BBL j/cm2: 12    PW Msec Sec: 10    Cooling Temp: 25    Pulses: 124    11mm: this one      3rd Pass   Location: Other   browns at hands   Device: 515  BBL j/cm2: 10    PW Msec Sec: 15    Cooling Temp: 22    Pulses: 95  11mm: this one    ACTINIC DAMAGE - chronic, secondary to cumulative UV radiation exposure/sun exposure over time - diffuse scaly erythematous macules with underlying dyspigmentation - Recommend daily broad spectrum sunscreen SPF 30+ to sun-exposed areas, reapply every 2 hours as needed.  - Recommend staying in the shade or wearing long sleeves, sun glasses (UVA+UVB protection) and wide brim hats (4-inch brim around the entire circumference of the hat). - Call for new or changing lesions.  Actinic keratosis right nasal bridge x 1  Actinic keratoses are precancerous spots that appear secondary to cumulative UV radiation exposure/sun exposure over time. They are chronic with expected duration over 1 year. A portion of actinic keratoses will progress to squamous cell carcinoma of the  skin. It is not possible to reliably predict which spots will progress to skin cancer and so treatment is recommended to prevent development of skin cancer.  Recommend daily broad spectrum sunscreen SPF 30+ to sun-exposed areas, reapply every 2 hours as needed.  Recommend staying in the shade or wearing long sleeves, sun glasses (UVA+UVB protection) and wide brim hats (4-inch brim around the entire circumference of the hat). Call for new or changing lesions.  Destruction of lesion - right nasal bridge x 1    No follow-ups on file.  IAsher Muir, CMA, am acting as scribe for Armida Sans, MD.   Documentation: I have reviewed the above documentation for accuracy and completeness, and I agree with the above.  Armida Sans, MD

## 2023-05-27 ENCOUNTER — Encounter: Payer: Self-pay | Admitting: Dermatology

## 2023-07-22 ENCOUNTER — Other Ambulatory Visit: Payer: Self-pay | Admitting: Dermatology

## 2023-07-22 DIAGNOSIS — L219 Seborrheic dermatitis, unspecified: Secondary | ICD-10-CM

## 2023-07-22 DIAGNOSIS — L209 Atopic dermatitis, unspecified: Secondary | ICD-10-CM

## 2023-08-22 ENCOUNTER — Other Ambulatory Visit: Payer: Self-pay | Admitting: Family Medicine

## 2023-08-22 DIAGNOSIS — R1032 Left lower quadrant pain: Secondary | ICD-10-CM

## 2023-10-15 ENCOUNTER — Encounter: Payer: Self-pay | Admitting: Dermatology

## 2023-10-15 ENCOUNTER — Ambulatory Visit (INDEPENDENT_AMBULATORY_CARE_PROVIDER_SITE_OTHER): Payer: Medicare Other | Admitting: Dermatology

## 2023-10-15 DIAGNOSIS — L821 Other seborrheic keratosis: Secondary | ICD-10-CM

## 2023-10-15 DIAGNOSIS — Z7189 Other specified counseling: Secondary | ICD-10-CM | POA: Diagnosis not present

## 2023-10-15 DIAGNOSIS — L820 Inflamed seborrheic keratosis: Secondary | ICD-10-CM

## 2023-10-15 DIAGNOSIS — L570 Actinic keratosis: Secondary | ICD-10-CM

## 2023-10-15 DIAGNOSIS — L578 Other skin changes due to chronic exposure to nonionizing radiation: Secondary | ICD-10-CM

## 2023-10-15 DIAGNOSIS — W908XXA Exposure to other nonionizing radiation, initial encounter: Secondary | ICD-10-CM

## 2023-10-15 DIAGNOSIS — L814 Other melanin hyperpigmentation: Secondary | ICD-10-CM

## 2023-10-15 DIAGNOSIS — L988 Other specified disorders of the skin and subcutaneous tissue: Secondary | ICD-10-CM

## 2023-10-15 NOTE — Progress Notes (Signed)
 Follow-Up Visit   Subjective  Teresa Cohen is a 78 y.o. female who presents for the following: here for bbl treatment to chest and hands. Patient spot at left corner of nose that is rough she would like checked.  Patient would also like to discuss treatment for loose skin at neck.   The patient has spots, moles and lesions to be evaluated, some may be new or changing and the patient may have concern these could be cancer.  The following portions of the chart were reviewed this encounter and updated as appropriate: medications, allergies, medical history  Review of Systems:  No other skin or systemic complaints except as noted in HPI or Assessment and Plan.  Objective  Well appearing patient in no apparent distress; mood and affect are within normal limits.  A focused examination was performed of the following areas: Face, chest and hands   Relevant exam findings are noted in the Assessment and Plan.                       Left Dorsal Hand x 3, left upper lip infranasal x 1 (4) Erythematous thin papules/macules with gritty scale.   See photo for left upper lip infranasal   right hand x 2 (2) Erythematous stuck-on, waxy papule or plaque  Assessment & Plan   ACTINIC DAMAGE ,LENTIGINES, AND EARLY SKS Exam: scattered tan macules on chest and hands see photos  Treatment Plan: BBL Treatment today at hands and chest  Laser safety: Patient was advised in laser safety.  Patient was fitted with laser safety goggles and advised to keep eyes closed during procedure with goggles on. Staff and provider ensured that patient and their own safety goggles were also on and eyes protected during procedure. Laser room door was secured and locked from the inside. Laser room door has laser safety sign affixed to the outside of the door.   Patient tolerated the procedure well.    Wynelle Link avoidance was stressed. The patient will call with any problems, questions or concerns prior  to their next appointment.   [place] BBL - 10/15/23 1800      Patient Details   Skin Type: II    Anesthestic Cream Applied: No    Photo Takes: Yes    Consent Signed: Yes    Improvement from Previous Treatment: Yes      Treatment Details   Date: 10/15/23    Treatment #: 2    Area: hand and chest    Filter: 1st Pass;2nd Pass      1st Pass   Location: Other   hands (actinic damage, early sks, lentigines)   Device: 515 filter    BBL j/cm2: 8    PW Msec Sec: 15    Cooling Temp: 22    Pulses: 151    11mm: this one      2nd Pass   Location: C   Chest (actinic damage, early sks, lentigines)   Device: 515    BBL j/cm2: 10    PW Msec Sec: 15    Cooling Temp: 22    11mm: this one        ELASTOSIS of skin of NECK Exam: Rhytides and loose skin of neck Treatment Plan: Loose skin at neck Discussed [facility] $250 per session for neck - 5 to 6 treatments Or recommend plastic surgeon for neck lift   Recommend daily broad spectrum sunscreen SPF 30+ to sun-exposed areas, reapply every 2 hours as needed.  Call for new or changing lesions.  Staying in the shade or wearing long sleeves, sun glasses (UVA+UVB protection) and wide brim hats (4-inch brim around the entire circumference of the hat) are also recommended for sun protection.   ACTINIC KERATOSIS (4) Left Dorsal Hand x 3, left upper lip infranasal x 1 (4) Actinic keratoses are precancerous spots that appear secondary to cumulative UV radiation exposure/sun exposure over time. They are chronic with expected duration over 1 year. A portion of actinic keratoses will progress to squamous cell carcinoma of the skin. It is not possible to reliably predict which spots will progress to skin cancer and so treatment is recommended to prevent development of skin cancer.  Recommend daily broad spectrum sunscreen SPF 30+ to sun-exposed areas, reapply every 2 hours as needed.  Recommend staying in the shade or wearing long sleeves, sun glasses  (UVA+UVB protection) and wide brim hats (4-inch brim around the entire circumference of the hat). Call for new or changing lesions. Destruction of lesion - Left Dorsal Hand x 3, left upper lip infranasal x 1 (4) INFLAMED SEBORRHEIC KERATOSIS (2) right hand x 2 (2) Symptomatic, irritating, patient would like treated. Destruction of lesion - right hand x 2 (2) Complexity: simple   Destruction method: cryotherapy   Informed consent: discussed and consent obtained   Timeout:  patient name, date of birth, surgical site, and procedure verified Lesion destroyed using liquid nitrogen: Yes   Region frozen until ice ball extended beyond lesion: Yes   Outcome: patient tolerated procedure well with no complications   Post-procedure details: wound care instructions given    ACTINIC DAMAGE - chronic, secondary to cumulative UV radiation exposure/sun exposure over time - diffuse scaly erythematous macules with underlying dyspigmentation - Recommend daily broad spectrum sunscreen SPF 30+ to sun-exposed areas, reapply every 2 hours as needed.  - Recommend staying in the shade or wearing long sleeves, sun glasses (UVA+UVB protection) and wide brim hats (4-inch brim around the entire circumference of the hat). - Call for new or changing lesions.  Return for schedule skin tight , schedule for tbse 02/13/2024 or later, schedule bbl in fall .  IRandee Busing, CMA, am acting as scribe for Celine Collard, MD.   Documentation: I have reviewed the above documentation for accuracy and completeness, and I agree with the above.  Celine Collard, MD

## 2023-10-15 NOTE — Patient Instructions (Addendum)
 Cryotherapy Aftercare  Wash gently with soap and water everyday.   Apply Vaseline and Band-Aid daily until healed.    Actinic keratoses are precancerous spots that appear secondary to cumulative UV radiation exposure/sun exposure over time. They are chronic with expected duration over 1 year. A portion of actinic keratoses will progress to squamous cell carcinoma of the skin. It is not possible to reliably predict which spots will progress to skin cancer and so treatment is recommended to prevent development of skin cancer.  Recommend daily broad spectrum sunscreen SPF 30+ to sun-exposed areas, reapply every 2 hours as needed.  Recommend staying in the shade or wearing long sleeves, sun glasses (UVA+UVB protection) and wide brim hats (4-inch brim around the entire circumference of the hat). Call for new or changing lesions.     Due to recent changes in healthcare laws, you may see results of your pathology and/or laboratory studies on MyChart before the doctors have had a chance to review them. We understand that in some cases there may be results that are confusing or concerning to you. Please understand that not all results are received at the same time and often the doctors may need to interpret multiple results in order to provide you with the best plan of care or course of treatment. Therefore, we ask that you please give [place] 2 business days to thoroughly review all your results before contacting the office for clarification. Should we see a critical lab result, you will be contacted sooner.   If You Need Anything After Your Visit  If you have any questions or concerns for your doctor, please call our main line at [phone] and press option 4 to reach your doctor's medical assistant. If no one answers, please leave a voicemail as directed and we will return your call as soon as possible. Messages left after 4 pm will be answered the following business day.   You may also send [place] a  message via MyChart. We typically respond to MyChart messages within 1-2 business days.  For prescription refills, please ask your pharmacy to contact our office. Our fax number is [phone].  If you have an urgent issue when the clinic is closed that cannot wait until the next business day, you can page your doctor at the number below.    Please note that while we do our best to be available for urgent issues outside of office hours, we are not available 24/7.   If you have an urgent issue and are unable to reach [place], you may choose to seek medical care at your doctor's office, retail clinic, urgent care center, or emergency room.  If you have a medical emergency, please immediately call 911 or go to the emergency department.  Pager Numbers  - Dr. Gwen Pounds: [phone]  - Dr. Roseanne Reno: [phone]  - Dr. Katrinka Blazing: [phone]   In the event of inclement weather, please call our main line at [phone] for an update on the status of any delays or closures.  Dermatology Medication Tips: Please keep the boxes that topical medications come in in order to help keep track of the instructions about where and how to use these. Pharmacies typically print the medication instructions only on the boxes and not directly on the medication tubes.   If your medication is too expensive, please contact our office at [phone] option 4 or send [place] a message through MyChart.   We are unable to tell what your co-pay for medications will be in advance  as this is different depending on your insurance coverage. However, we may be able to find a substitute medication at lower cost or fill out paperwork to get insurance to cover a needed medication.   If a prior authorization is required to get your medication covered by your insurance company, please allow [place] 1-2 business days to complete this process.  Drug prices often vary depending on where the prescription is filled and some pharmacies may offer  cheaper prices.  The website www.goodrx.com contains coupons for medications through different pharmacies. The prices here do not account for what the cost may be with help from insurance (it may be cheaper with your insurance), but the website can give you the price if you did not use any insurance.  - You can print the associated coupon and take it with your prescription to the pharmacy.  - You may also stop by our office during regular business hours and pick up a GoodRx coupon card.  - If you need your prescription sent electronically to a different pharmacy, notify our office through [facility] or by phone at [phone] option 4.     Si Usted Necesita Algo Despus de Su Visita  Tambin puede enviarnos un mensaje a travs de Clinical cytogeneticist. Por lo general respondemos a los mensajes de MyChart en el transcurso de 1 a 2 das hbiles.  Para renovar recetas, por favor pida a su farmacia que se ponga en contacto con nuestra oficina. Annie Sable de fax es [place] [phone].  Si tiene un asunto urgente cuando la clnica est cerrada y que no puede esperar hasta el siguiente da hbil, puede llamar/localizar a su doctor(a) al nmero que aparece a continuacin.   Por favor, tenga en cuenta que aunque hacemos todo lo posible para estar disponibles para asuntos urgentes fuera del horario de [place], no estamos disponibles las 24 horas del da, los 7 [address]  [address] de la [place].   Si tiene un problema urgente y no puede comunicarse con nosotros, puede optar por buscar atencin mdica  en el consultorio de su doctor(a), en una clnica privada, en un centro de atencin urgente o en una sala de emergencias.  Si tiene Engineer, drilling, por favor llame inmediatamente al 911 o vaya a la sala de emergencias.  Nmeros de bper  - Dr. Gwen Pounds: [phone]  - Dra. Roseanne Reno: [phone]  - Dr. Katrinka Blazing: [phone]   En caso de inclemencias del tiempo, por favor llame a Lacy Duverney principal al  [phone] para una actualizacin sobre el [place] de cualquier retraso o cierre.  Consejos para la medicacin en dermatologa: Por favor, guarde las cajas en las que vienen los medicamentos de uso tpico para ayudarle a seguir las instrucciones sobre dnde y cmo usarlos. Las farmacias generalmente imprimen las instrucciones del medicamento slo en las cajas y no directamente en los tubos del [place].   Si su medicamento es muy caro, por favor, pngase en contacto con Rolm Gala llamando al [phone] y presione la opcin 4 o envenos un mensaje a travs de Clinical cytogeneticist.   No podemos decirle cul ser su copago por los medicamentos por adelantado ya que esto es diferente dependiendo de la cobertura de su seguro. Sin embargo, es posible que podamos encontrar un medicamento sustituto a Audiological scientist un formulario para que el seguro cubra el medicamento que se considera necesario.   Si se requiere una autorizacin previa para que su compaa de seguros [place] su medicamento, por favor permtanos de 1 a 2  das hbiles para completar este proceso.  Los precios de los medicamentos varan con frecuencia dependiendo del Environmental consultant de dnde se surte la receta y alguna farmacias pueden ofrecer precios ms baratos.  El sitio web www.goodrx.com tiene cupones para medicamentos de Health and safety inspector. Los precios aqu no tienen en cuenta lo que podra costar con la ayuda del seguro (puede ser ms barato con su seguro), pero el sitio web puede darle el precio si no utiliz Tourist information centre manager.  - Puede imprimir el cupn correspondiente y llevarlo con su receta a la farmacia.  - Tambin puede pasar por nuestra oficina durante el horario de atencin regular y Education officer, museum una tarjeta de cupones de GoodRx.  - Si necesita que su receta se enve electrnicamente a una farmacia diferente, informe a nuestra oficina a travs de MyChart de [place] o por telfono llamando al [phone] y presione la opcin 4.

## 2023-10-16 ENCOUNTER — Telehealth: Payer: Self-pay

## 2023-10-16 NOTE — Telephone Encounter (Signed)
 Called to check on patient after previous BBL treatment at hands and chest. Patient reports brown spots have darkened at hand but are not bothersome. Otherwise happy with results. Chest is looks great. Denies any concerns. Appreciative of phone call.

## 2023-12-08 IMAGING — MG DIGITAL DIAGNOSTIC BILAT W/ TOMO W/ CAD
8 series · 9 of 24 positions shown · non-contrast
Comparison: Previous exam(s).

CLINICAL DATA: 76-year-old asymptomatic female presenting for
routine annual mammography. She has history of stereotactic biopsy
of 2 sites in the left breast in 9198 with pathology results
demonstrating benign fibrocystic changes. She has also had 2 more
remote benign biopsies in the right breast.

EXAM:
DIGITAL DIAGNOSTIC BILATERAL MAMMOGRAM WITH TOMOSYNTHESIS AND CAD
TECHNIQUE: Bilateral digital diagnostic mammography and breast tomosynthesis
was performed. The images were evaluated with computer-aided
detection.

[R CC synth-2D]
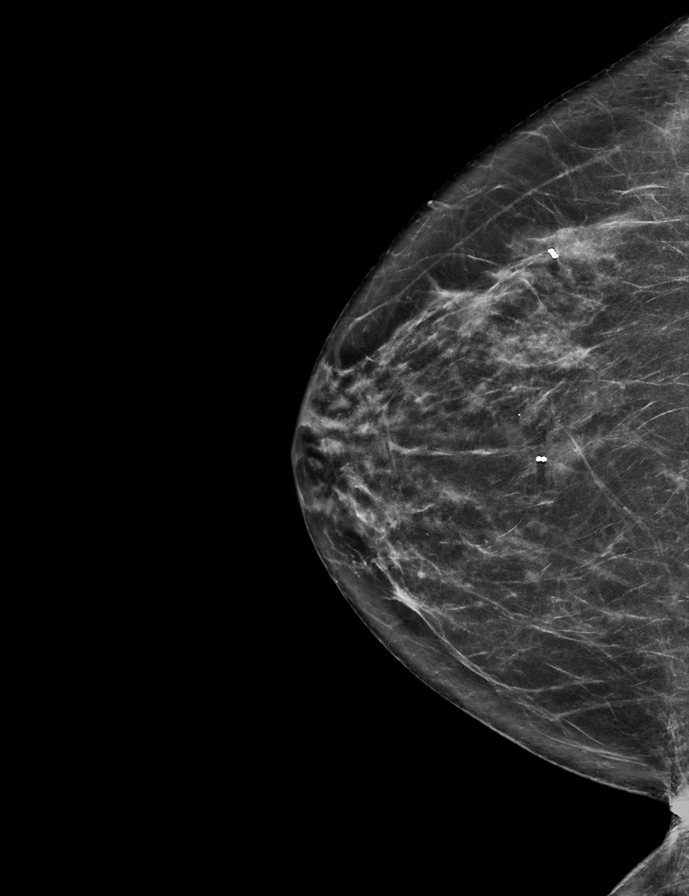

[R MLO synth-2D]
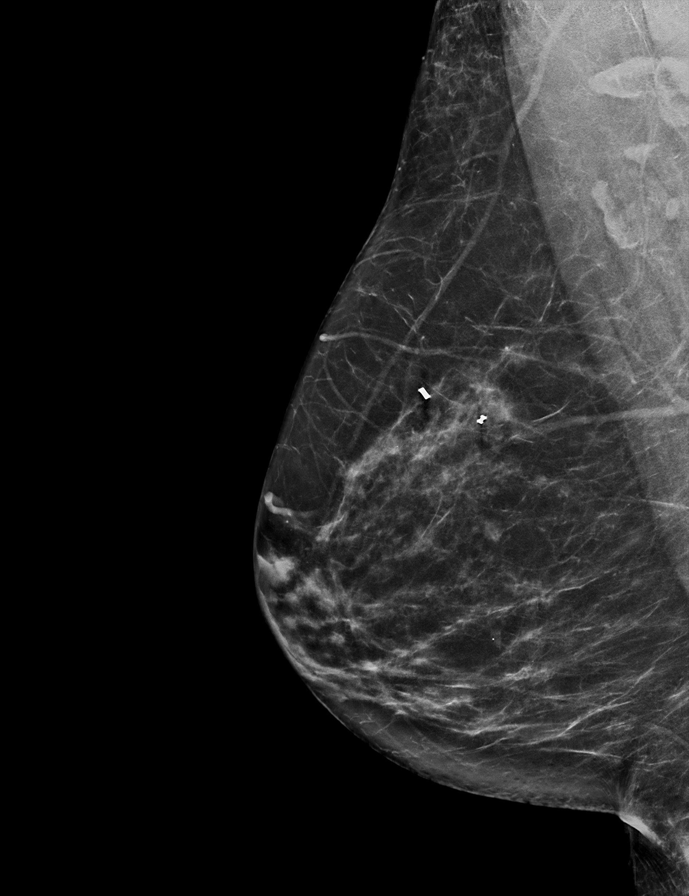

[L CC synth-2D]
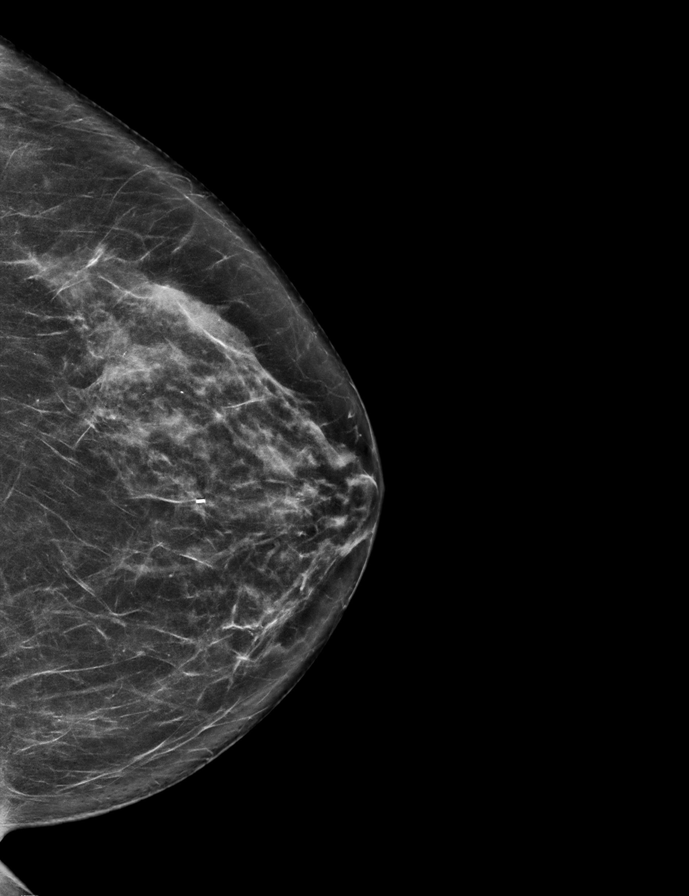

[L MLO synth-2D]
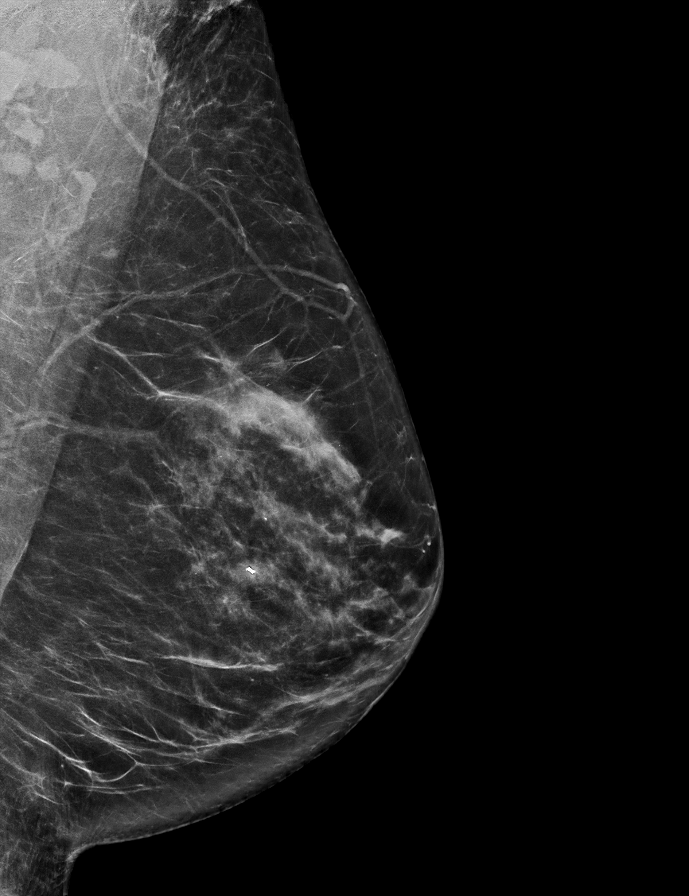

[R MLO tomo · 2 of 68 frames shown]
[frame 22/68]
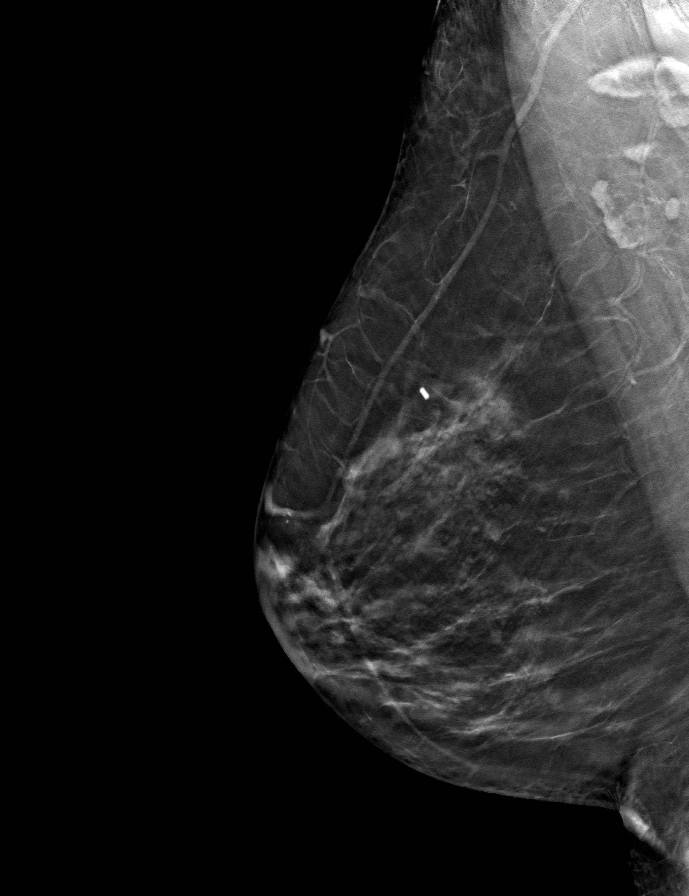
[frame 35/68]
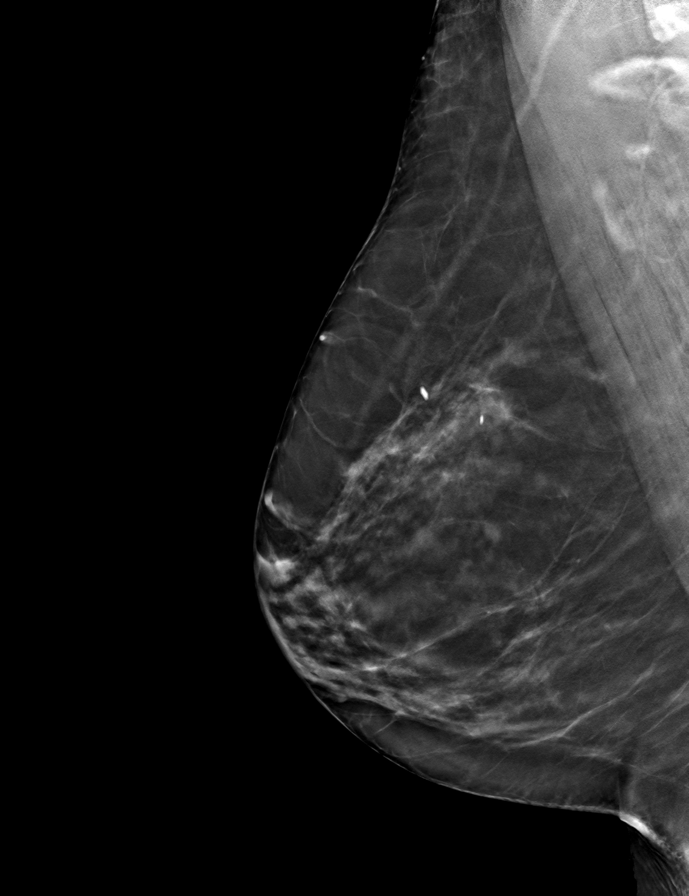

[R CC tomo · tomo slice 32/63.0]
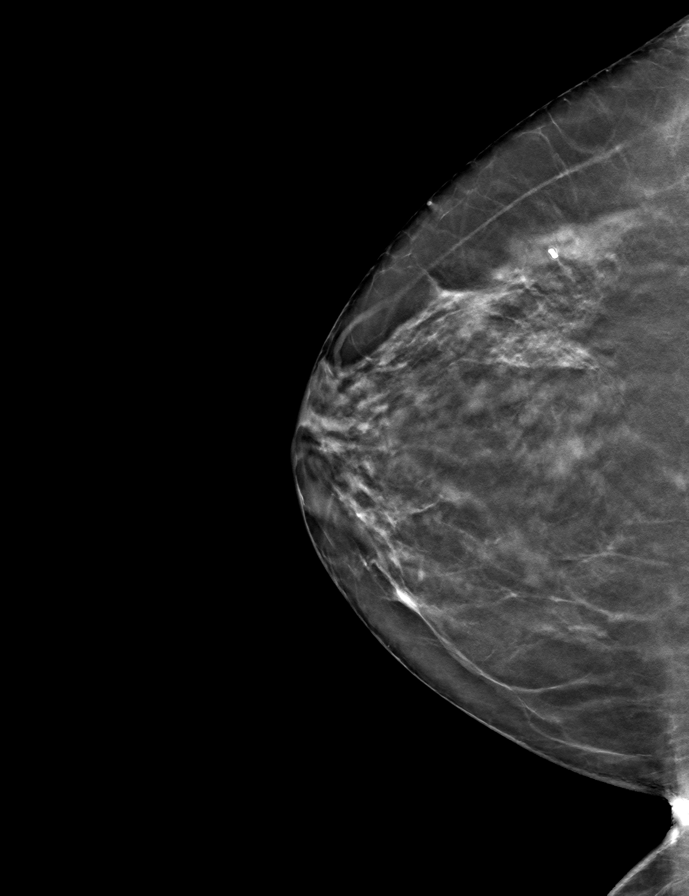

[L MLO tomo · tomo slice 35/70.0]
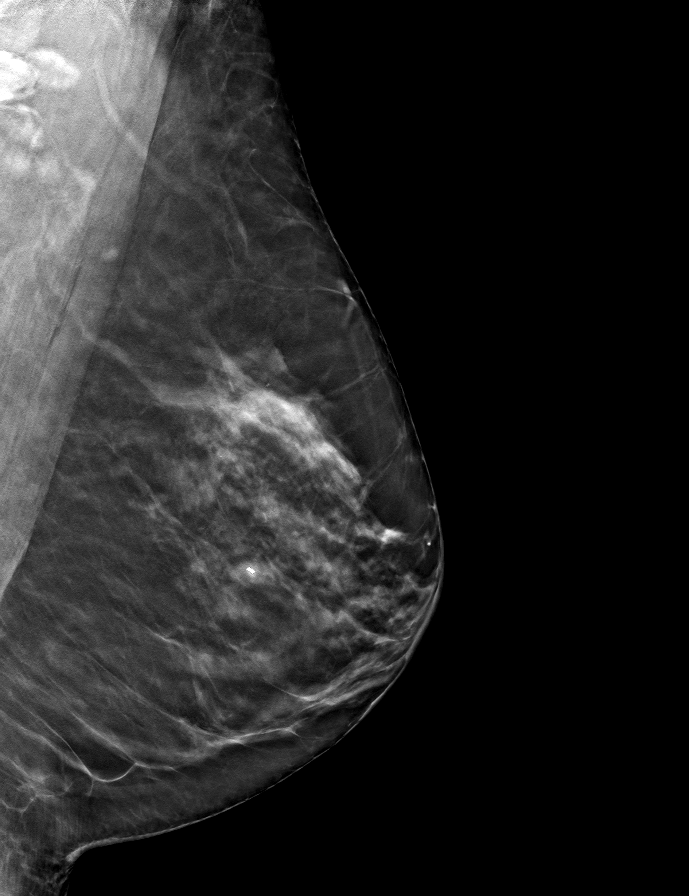

[L CC tomo · tomo slice 34/67.0]
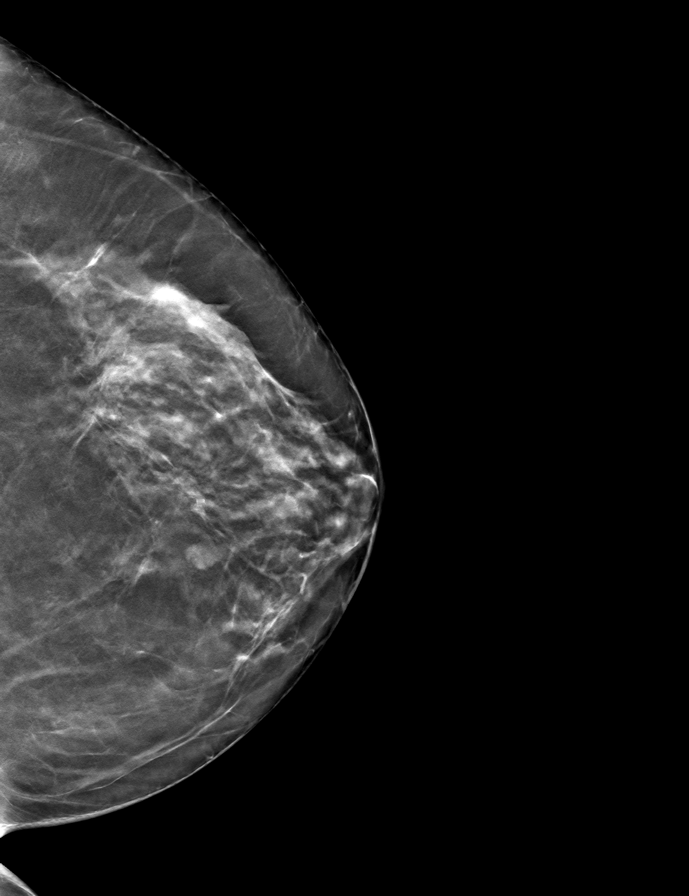

[9 of 24 positions shown; findings below may reference images not displayed]

ACR Breast Density Category c: The breast tissue is heterogeneously
dense, which may obscure small masses.
FINDINGS: No suspicious calcifications, masses or areas of distortion are seen
in the bilateral breasts.
IMPRESSION: No mammographic evidence of malignancy in the bilateral breasts.

RECOMMENDATION:
Screening mammogram in one year.(Code:AL-G-0XO)

I have discussed the findings and recommendations with the patient.
If applicable, a reminder letter will be sent to the patient
regarding the next appointment.

BI-RADS CATEGORY  1: Negative.

## 2024-03-09 ENCOUNTER — Ambulatory Visit: Payer: Medicare Other | Admitting: Dermatology

## 2024-03-09 ENCOUNTER — Encounter: Payer: Self-pay | Admitting: Dermatology

## 2024-03-09 DIAGNOSIS — L578 Other skin changes due to chronic exposure to nonionizing radiation: Secondary | ICD-10-CM | POA: Diagnosis not present

## 2024-03-09 DIAGNOSIS — D1801 Hemangioma of skin and subcutaneous tissue: Secondary | ICD-10-CM | POA: Diagnosis not present

## 2024-03-09 DIAGNOSIS — L814 Other melanin hyperpigmentation: Secondary | ICD-10-CM

## 2024-03-09 DIAGNOSIS — D229 Melanocytic nevi, unspecified: Secondary | ICD-10-CM

## 2024-03-09 DIAGNOSIS — L820 Inflamed seborrheic keratosis: Secondary | ICD-10-CM

## 2024-03-09 DIAGNOSIS — L821 Other seborrheic keratosis: Secondary | ICD-10-CM

## 2024-03-09 DIAGNOSIS — W908XXA Exposure to other nonionizing radiation, initial encounter: Secondary | ICD-10-CM | POA: Diagnosis not present

## 2024-03-09 DIAGNOSIS — L570 Actinic keratosis: Secondary | ICD-10-CM | POA: Diagnosis not present

## 2024-03-09 DIAGNOSIS — Z1283 Encounter for screening for malignant neoplasm of skin: Secondary | ICD-10-CM

## 2024-03-09 NOTE — Progress Notes (Signed)
 Follow-Up Visit   Subjective  Teresa Cohen is a 78 y.o. female who presents for the following: Skin Cancer Screening and Full Body Skin Exam  C/o spot on L inner corner of eye, R inner corner of eye, L upper thigh. No previous hx of skin cancer or abnormal moles  The patient presents for Total-Body Skin Exam (TBSE) for skin cancer screening and mole check. The patient has spots, moles and lesions to be evaluated, some may be new or changing and the patient may have concern these could be cancer.  The following portions of the chart were reviewed this encounter and updated as appropriate: medications, allergies, medical history  Review of Systems:  No other skin or systemic complaints except as noted in HPI or Assessment and Plan.  Objective  Well appearing patient in no apparent distress; mood and affect are within normal limits.  A full examination was performed including scalp, head, eyes, ears, nose, lips, neck, chest, axillae, abdomen, back, buttocks, bilateral upper extremities, bilateral lower extremities, hands, feet, fingers, toes, fingernails, and toenails. All findings within normal limits unless otherwise noted below.   Relevant physical exam findings are noted in the Assessment and Plan.  Chest - Medial [facility]), L Medial canthus lower eyelid margin; Right Hand - Posterior (2), Left Hand - Posterior, Left Medial Canthus, Left Thigh - Anterior, Right Ankle - Anterior, Right Foot - Anterior Stuck-on, waxy, tan-brown papule or plaque --Discussed benign etiology and prognosis.  Right Hand - Posterior, Right Medial Canthus, Right Nasal Sidewall Erythematous thin papules/macules with gritty scale.   Assessment & Plan   SKIN CANCER SCREENING PERFORMED TODAY.  ACTINIC DAMAGE - Chronic condition, secondary to cumulative UV/sun exposure - diffuse scaly erythematous macules with underlying dyspigmentation - Recommend daily broad spectrum sunscreen SPF 30+ to sun-exposed areas,  reapply every 2 hours as needed.  - Staying in the shade or wearing long sleeves, sun glasses (UVA+UVB protection) and wide brim hats (4-inch brim around the entire circumference of the hat) are also recommended for sun protection.  - Call for new or changing lesions.  LENTIGINES, SEBORRHEIC KERATOSES, HEMANGIOMAS - Benign normal skin lesions - Benign-appearing - Call for any changes  MELANOCYTIC NEVI - Tan-brown and/or pink-flesh-colored symmetric macules and papules - Benign appearing on exam today - Observation - Call clinic for new or changing moles - Recommend daily use of broad spectrum spf 30+ sunscreen to sun-exposed areas.    ACTINIC KERATOSIS Exam: Erythematous thin papules/macules with gritty scale at the  Actinic keratoses are precancerous spots that appear secondary to cumulative UV radiation exposure/sun exposure over time. They are chronic with expected duration over 1 year. A portion of actinic keratoses will progress to squamous cell carcinoma of the skin. It is not possible to reliably predict which spots will progress to skin cancer and so treatment is recommended to prevent development of skin cancer.  Recommend daily broad spectrum sunscreen SPF 30+ to sun-exposed areas, reapply every 2 hours as needed.  Recommend staying in the shade or wearing long sleeves, sun glasses (UVA+UVB protection) and wide brim hats (4-inch brim around the entire circumference of the hat). Call for new or changing lesions. Treatment Plan: Treatment with LN2. Instructed patient to call office if persists or returns within the next few months  Irritated Seborrheic Keratosis L Medial canthus lower eyelid margin Exam: benign age-related growth have now become irritated Treatment Plan: LN2 Treatment - documented below.  INFLAMED SEBORRHEIC KERATOSIS (8) Chest - Medial [facility]), L Medial canthus  lower eyelid margin; Right Hand - Posterior (2), Left Hand - Posterior, Left Medial Canthus, Left  Thigh - Anterior, Right Ankle - Anterior, Right Foot - Anterior Destruction of lesion - Chest - Medial (Center), L Medial canthus lower eyelid margin; Right Hand - Posterior (2), Left Hand - Posterior, Left Medial Canthus, Left Thigh - Anterior, Right Ankle - Anterior, Right Foot - Anterior Complexity: simple   Destruction method: cryotherapy   Informed consent: discussed and consent obtained   Timeout:  patient name, date of birth, surgical site, and procedure verified Lesion destroyed using liquid nitrogen: Yes   Region frozen until ice ball extended beyond lesion: Yes   Outcome: patient tolerated procedure well with no complications   Post-procedure details: wound care instructions given    AK (ACTINIC KERATOSIS) (3) Right Hand - Posterior, Right Medial Canthus, Right Nasal Sidewall Destruction of lesion - Right Hand - Posterior, Right Medial Canthus, Right Nasal Sidewall Complexity: simple   Destruction method: cryotherapy   Informed consent: discussed and consent obtained   Timeout:  patient name, date of birth, surgical site, and procedure verified Lesion destroyed using liquid nitrogen: Yes   Region frozen until ice ball extended beyond lesion: Yes   Outcome: patient tolerated procedure well with no complications   Post-procedure details: wound care instructions given    ACTINIC SKIN DAMAGE   SKIN CANCER SCREENING   LENTIGO   MELANOCYTIC NEVUS, UNSPECIFIED LOCATION   Return in about 4 weeks (around 04/06/2024) for BBL.  I, Gordan Beams, CMA, am acting as scribe for Alm Rhyme, MD.   Documentation: I have reviewed the above documentation for accuracy and completeness, and I agree with the above.  Alm Rhyme, MD

## 2024-04-08 ENCOUNTER — Ambulatory Visit (INDEPENDENT_AMBULATORY_CARE_PROVIDER_SITE_OTHER): Admitting: Dermatology

## 2024-04-08 DIAGNOSIS — L820 Inflamed seborrheic keratosis: Secondary | ICD-10-CM

## 2024-04-08 DIAGNOSIS — L821 Other seborrheic keratosis: Secondary | ICD-10-CM | POA: Diagnosis not present

## 2024-04-08 DIAGNOSIS — W908XXA Exposure to other nonionizing radiation, initial encounter: Secondary | ICD-10-CM

## 2024-04-08 DIAGNOSIS — L570 Actinic keratosis: Secondary | ICD-10-CM

## 2024-04-08 DIAGNOSIS — L905 Scar conditions and fibrosis of skin: Secondary | ICD-10-CM | POA: Diagnosis not present

## 2024-04-08 DIAGNOSIS — L814 Other melanin hyperpigmentation: Secondary | ICD-10-CM

## 2024-04-08 DIAGNOSIS — L578 Other skin changes due to chronic exposure to nonionizing radiation: Secondary | ICD-10-CM

## 2024-04-08 NOTE — Progress Notes (Signed)
 Follow-Up Visit   Subjective  Teresa Cohen is a 78 y.o. female who presents for the following: here for bbl treatment on hands and chest. Patient also reports some spots at face and nose she would like checked.    The following portions of the chart were reviewed this encounter and updated as appropriate: medications, allergies, medical history  Review of Systems:  No other skin or systemic complaints except as noted in HPI or Assessment and Plan.  Objective  Well appearing patient in no apparent distress; mood and affect are within normal limits.   A focused examination was performed of the following areas: Hands, chest, face  Relevant exam findings are noted in the Assessment and Plan.  Before treatment at chest                       above left medial eyebrow x 1, right proximal dorsum thumb x 1 (2) Erythematous stuck-on, waxy papule or plaque right nasal bridge x 1 Erythematous thin papules/macules with gritty scale.    Assessment & Plan   Scar at right side of nose  Exam: linear scar at right side of nose see photo  Treatment Plan: Benign. Observe.  Do not recommend treatment  SEBORRHEIC KERATOSIS - Stuck-on, waxy, tan-brown papules and/or plaques  - Benign-appearing - Discussed benign etiology and prognosis. - Observe - Call for any changes  LENTIGINES Exam: scattered tan macules Due to sun exposure Treatment Plan: Benign-appearing, observe. Recommend daily broad spectrum sunscreen SPF 30+ to sun-exposed areas, reapply every 2 hours as needed.  Call for any changes   ACTINIC DAMAGE ,LENTIGINES, AND EARLY SKS Exam: scattered tan macules on chest and hands see photos  Treatment Plan: BBL Treatment today at hands and chest   Laser safety: Patient was advised in laser safety.  Patient was fitted with laser safety goggles and advised to keep eyes closed during procedure with goggles on. Staff and provider ensured that patient and their  own safety goggles were also on and eyes protected during procedure. Laser room door was secured and locked from the inside. Laser room door has laser safety sign affixed to the outside of the door.    Patient tolerated the procedure well.    [place] avoidance was stressed. The patient will call with any problems, questions or concerns prior to their next appointment.    [place] BBL - 04/08/24 1600      Patient Details   Skin Type: I    Anesthestic Cream Applied: No    Photo Takes: Yes    Consent Signed: Yes    Improvement from Previous Treatment: Yes      Treatment Details   Date: 04/08/24    Treatment #: 2    Area: hands    Filter: 1st Pass;2nd Pass;3rd Pass;4th Pass      1st Pass   Location: Other   hands to treat spots   Device: 515   hands - spot treatment   BBL j/cm2: 15    PW Msec Sec: 15    Cooling Temp: 15    Pulses: 64    11mm: this one      2nd Pass   Location: C   chest - spot treatment   Device: 515 filter   spot treatment to chest   BBL j/cm2: 15    PW Msec Sec: 15    Cooling Temp: 15    Pulses: 19    11mm: this one  3rd Pass   Location: Other   hands - all over treatment   Device: 515   for pigmented lesions on hands   BBL j/cm2: 10    PW Msec Sec: 15    Cooling Temp: 22    Pulses: 88    15x15: this one      4th Pass   Location: C    Device: 515 filter   pigmented lesions on chest   BBL j/cm2: 9    PW Msec Sec: 15    Cooling Temp: 22    Pulses: 93    15x15: this one       ACTINIC DAMAGE - chronic, secondary to cumulative UV radiation exposure/sun exposure over time - diffuse scaly erythematous macules with underlying dyspigmentation - Recommend daily broad spectrum sunscreen SPF 30+ to sun-exposed areas, reapply every 2 hours as needed.  - Recommend staying in the shade or wearing long sleeves, sun glasses (UVA+UVB protection) and wide brim hats (4-inch brim around the entire circumference of the hat). - Call for new or changing  lesions.    INFLAMED SEBORRHEIC KERATOSIS (2) above left medial eyebrow x 1, right proximal dorsum thumb x 1 (2) Symptomatic, irritating, patient would like treated.  Will recheck areas at next follow up Destruction of lesion - above left medial eyebrow x 1, right proximal dorsum thumb x 1 (2) Complexity: simple   Destruction method: cryotherapy   Informed consent: discussed and consent obtained   Timeout:  patient name, date of birth, surgical site, and procedure verified Lesion destroyed using liquid nitrogen: Yes   Region frozen until ice ball extended beyond lesion: Yes   Outcome: patient tolerated procedure well with no complications   Post-procedure details: wound care instructions given    ACTINIC KERATOSIS right nasal bridge x 1 Ak at Right nasal bridge see photos Discussed may consider cancer cream in future   Actinic keratoses are precancerous spots that appear secondary to cumulative UV radiation exposure/sun exposure over time. They are chronic with expected duration over 1 year. A portion of actinic keratoses will progress to squamous cell carcinoma of the skin. It is not possible to reliably predict which spots will progress to skin cancer and so treatment is recommended to prevent development of skin cancer.  Recommend daily broad spectrum sunscreen SPF 30+ to sun-exposed areas, reapply every 2 hours as needed.  Recommend staying in the shade or wearing long sleeves, sun glasses (UVA+UVB protection) and wide brim hats (4-inch brim around the entire circumference of the hat). Call for new or changing lesions. Destruction of lesion - right nasal bridge x 1 Complexity: simple   Destruction method: cryotherapy   Informed consent: discussed and consent obtained   Timeout:  patient name, date of birth, surgical site, and procedure verified Lesion destroyed using liquid nitrogen: Yes   Region frozen until ice ball extended beyond lesion: Yes   Outcome: patient tolerated  procedure well with no complications   Post-procedure details: wound care instructions given     Return for keep follow up as scheduled .  IEleanor Blush, CMA, am acting as scribe for Alm Rhyme, MD.   Documentation: I have reviewed the above documentation for accuracy and completeness, and I agree with the above.  Alm Rhyme, MD

## 2024-04-08 NOTE — Patient Instructions (Addendum)
 Recommend  Beverley Leeroy Free, M.D. [address] [place], [place] [address] [phone]  [email or web address]     Due to recent changes in healthcare laws, you may see results of your pathology and/or laboratory studies on MyChart before the doctors have had a chance to review them. We understand that in some cases there may be results that are confusing or concerning to you. Please understand that not all results are received at the same time and often the doctors may need to interpret multiple results in order to provide you with the best plan of care or course of treatment. Therefore, we ask that you please give us  2 business days to thoroughly review all your results before contacting the office for clarification. Should we see a critical lab result, you will be contacted sooner.   If You Need Anything After Your Visit  If you have any questions or concerns for your doctor, please call our main line at [phone] and press option 4 to reach your doctor's medical assistant. If no one answers, please leave a voicemail as directed and we will return your call as soon as possible. Messages left after 4 pm will be answered the following business day.   You may also send us  a message via MyChart. We typically respond to MyChart messages within 1-2 business days.  For prescription refills, please ask your pharmacy to contact our office. Our fax number is [phone].  If you have an urgent issue when the clinic is closed that cannot wait until the next business day, you can page your doctor at the number below.    Please note that while we do our best to be available for urgent issues outside of office hours, we are not available 24/7.   If you have an urgent issue and are unable to reach us , you may choose to seek medical care at your doctor's office, retail clinic, urgent care center, or emergency room.  If you have a medical emergency, please immediately call 911 or  go to the emergency department.  Pager Numbers  - Dr. Hester: [phone]  - Dr. Jackquline: [phone]  - Dr. Claudene: [phone]   - Dr. Raymund: [phone]  In the event of inclement weather, please call our main line at [phone] for an update on the status of any delays or closures.  Dermatology Medication Tips: Please keep the boxes that topical medications come in in order to help keep track of the instructions about where and how to use these. Pharmacies typically print the medication instructions only on the boxes and not directly on the medication tubes.   If your medication is too expensive, please contact our office at [phone] option 4 or send us  a message through MyChart.   We are unable to tell what your co-pay for medications will be in advance as this is different depending on your insurance coverage. However, we may be able to find a substitute medication at lower cost or fill out paperwork to get insurance to cover a needed medication.   If a prior authorization is required to get your medication covered by your insurance company, please allow us  1-2 business days to complete this process.  Drug prices often vary depending on where the prescription is filled and some pharmacies may offer cheaper prices.  The website www.goodrx.com contains coupons for medications through different pharmacies. The prices here do not account for what the cost may be with help from insurance (it may be cheaper with  your insurance), but the website can give you the price if you did not use any insurance.  - You can print the associated coupon and take it with your prescription to the pharmacy.  - You may also stop by our office during regular business hours and pick up a GoodRx coupon card.  - If you need your prescription sent electronically to a different pharmacy, notify our office through [facility] or by phone at [phone] option 4.     Si Usted Necesita Algo  Despus de Su Visita  Tambin puede enviarnos un mensaje a travs de Clinical cytogeneticist. Por lo general respondemos a los mensajes de MyChart en el transcurso de 1 a 2 das hbiles.  Para renovar recetas, por favor pida a su farmacia que se ponga en contacto con nuestra oficina. Randi lakes de fax es [place] [phone].  Si tiene un asunto urgente cuando la clnica est cerrada y que no puede esperar hasta el siguiente da hbil, puede llamar/localizar a su doctor(a) al nmero que aparece a continuacin.   Por favor, tenga en cuenta que aunque hacemos todo lo posible para estar disponibles para asuntos urgentes fuera del horario de [place], no estamos disponibles las 24 horas del da, los 7 [address]  [address] de la [place].   Si tiene un problema urgente y no puede comunicarse con nosotros, puede optar por buscar atencin mdica  en el consultorio de su doctor(a), en una clnica privada, en un centro de atencin urgente o en una sala de emergencias.  Si tiene Engineer, drilling, por favor llame inmediatamente al 911 o vaya a la sala de emergencias.  Nmeros de bper  - Dr. Hester: [phone]  - Dra. Jackquline: [phone]  - Dr. Claudene: [phone]  - Dra. Kitts: [phone]  En caso de inclemencias del [place], por favor llame a nuestra lnea principal al [phone] para una actualizacin sobre el estado de cualquier retraso o cierre.  Consejos para la medicacin en dermatologa: Por favor, guarde las cajas en las que vienen los medicamentos de uso tpico para ayudarle a seguir las instrucciones sobre dnde y cmo usarlos. Las farmacias generalmente imprimen las instrucciones del medicamento slo en las cajas y no directamente en los tubos del [place].   Si su medicamento es muy caro, por favor, pngase en contacto con landry rieger llamando al [phone] y presione la opcin 4 o envenos un mensaje a travs de Clinical cytogeneticist.   No podemos decirle cul ser su copago por los medicamentos por  adelantado ya que esto es diferente dependiendo de la cobertura de su seguro. Sin embargo, es posible que podamos encontrar un medicamento sustituto a Audiological scientist un formulario para que el seguro cubra el medicamento que se considera necesario.   Si se requiere una autorizacin previa para que su compaa de seguros malta su medicamento, por favor permtanos de 1 a 2 das hbiles para completar este proceso.  Los precios de los medicamentos varan con frecuencia dependiendo del Environmental consultant de dnde se surte la receta y alguna farmacias pueden ofrecer precios ms baratos.  El sitio web www.goodrx.com tiene cupones para medicamentos de Health and safety inspector. Los precios aqu no tienen en cuenta lo que podra costar con la ayuda del seguro (puede ser ms barato con su seguro), pero el sitio web puede darle el precio si no utiliz Tourist information centre manager.  - Puede imprimir el cupn correspondiente y llevarlo con su receta a la farmacia.  - Tambin puede pasar por nuestra oficina durante el horario de atencin  regular y recoger una tarjeta de cupones de GoodRx.  - Si necesita que su receta se enve electrnicamente a una farmacia diferente, informe a nuestra oficina a travs de MyChart de [place] o por telfono llamando al [phone] y presione la opcin 4.

## 2024-04-13 ENCOUNTER — Encounter: Payer: Self-pay | Admitting: Dermatology

## 2024-05-18 ENCOUNTER — Ambulatory Visit (INDEPENDENT_AMBULATORY_CARE_PROVIDER_SITE_OTHER): Payer: Self-pay | Admitting: Dermatology

## 2024-05-18 DIAGNOSIS — L570 Actinic keratosis: Secondary | ICD-10-CM | POA: Diagnosis not present

## 2024-05-18 DIAGNOSIS — L814 Other melanin hyperpigmentation: Secondary | ICD-10-CM

## 2024-05-18 DIAGNOSIS — L578 Other skin changes due to chronic exposure to nonionizing radiation: Secondary | ICD-10-CM

## 2024-05-18 DIAGNOSIS — L820 Inflamed seborrheic keratosis: Secondary | ICD-10-CM

## 2024-05-18 DIAGNOSIS — W908XXA Exposure to other nonionizing radiation, initial encounter: Secondary | ICD-10-CM

## 2024-05-18 DIAGNOSIS — Z872 Personal history of diseases of the skin and subcutaneous tissue: Secondary | ICD-10-CM

## 2024-05-18 NOTE — Progress Notes (Unsigned)
 Follow-Up Visit   Subjective  Teresa Cohen is a 78 y.o. female who presents for the following: 4th bbl treatment today to face  Also would like spot at nose recheck and spot at chest looked at   The following portions of the chart were reviewed this encounter and updated as appropriate: medications, allergies, medical history  Review of Systems:  No other skin or systemic complaints except as noted in HPI or Assessment and Plan.  Objective  Well appearing patient in no apparent distress; mood and affect are within normal limits.   A focused examination was performed of the following areas: Face, hands, neck, chest   Relevant exam findings are noted in the Assessment and Plan. BBL for Face treatment of brown spots  BBL for Face treatment of brown spots  BBL for Face treatment of brown spots  BBL for Face treatment of brown spots  BBL for Face treatment of brown spots   Settings for Face Pass 1 All over brown spots at face   All over brown spots at face   2nd pass  All over brown spots at face  All over brown spots at face   hands x 17, right chest x 1 Erythematous stuck-on, waxy papule or plaque mid to dorsum nose x 1 Erythematous thin papules/macules with gritty scale.   Assessment & Plan   ACTINIC DAMAGE ,LENTIGINES, AND EARLY SKS Exam: scattered tan macules on chest and hands see photos  Treatment Plan: BBL Treatment today at hands and chest   Laser safety: Patient was advised in laser safety.  Patient was fitted with laser safety goggles and advised to keep eyes closed during procedure with goggles on. Staff and provider ensured that patient and their own safety goggles were also on and eyes protected during procedure. Laser room door was secured and locked from the inside. Laser room door has laser safety sign affixed to the outside of the door.    [place] BBL - 05/18/24 1700      Patient Details   Skin Type: I    Anesthestic Cream Applied: No     Photo Takes: Yes    Consent Signed: Yes    Improvement from Previous Treatment: Yes      Treatment Details   Date: 05/18/24    Treatment #: 4    Area: face    Filter: 1st Pass;2nd Pass      1st Pass   Location: F   all over  pigmented lesions on face   Device: 515   forehead, temples, cheeks, nose, chin, upper lip   BBL j/cm2: 15    PW Msec Sec: 15    Cooling Temp: 15    Pulses: 88    11mm: this one      2nd Pass   Location: F    Device: 515 filter   all over treatment for browns on face   BBL j/cm2: 12    PW Msec Sec: 10    Cooling Temp: 25    Pulses: 130    15x15: this one     Patient tolerated the procedure well.    [place] avoidance was stressed. The patient will call with any problems, questions or concerns prior to their next appointment.   ACTINIC DAMAGE - chronic, secondary to cumulative UV radiation exposure/sun exposure over time - diffuse scaly erythematous macules with underlying dyspigmentation - Recommend daily broad spectrum sunscreen SPF 30+ to sun-exposed areas, reapply every 2 hours as needed.  -  Recommend staying in the shade or wearing long sleeves, sun glasses (UVA+UVB protection) and wide brim hats (4-inch brim around the entire circumference of the hat). - Call for new or changing lesions.  HISTORY OF PRECANCEROUS ACTINIC KERATOSIS Right nasal bridge   - site(s) of PreCancerous Actinic Keratosis clear today. - these may recur and new lesions may form requiring treatment to prevent transformation into skin cancer - observe for new or changing spots and contact [facility] for appointment if occur - photoprotection with sun protective clothing; sunglasses and broad spectrum sunscreen with SPF of at least 30 + and frequent self skin exams recommended - yearly exams by a dermatologist recommended for persons with history of PreCancerous Actinic Keratoses  INFLAMED SEBORRHEIC KERATOSIS hands x 17, right chest x 1 Symptomatic, irritating,  patient would like treated. Destruction of lesion - hands x 17, right chest x 1 Complexity: simple   Destruction method: cryotherapy   Informed consent: discussed and consent obtained   Timeout:  patient name, date of birth, surgical site, and procedure verified Lesion destroyed using liquid nitrogen: Yes   Region frozen until ice ball extended beyond lesion: Yes   Outcome: patient tolerated procedure well with no complications   Post-procedure details: wound care instructions given    ACTINIC KERATOSIS mid to dorsum nose x 1 Actinic keratoses are precancerous spots that appear secondary to cumulative UV radiation exposure/sun exposure over time. They are chronic with expected duration over 1 year. A portion of actinic keratoses will progress to squamous cell carcinoma of the skin. It is not possible to reliably predict which spots will progress to skin cancer and so treatment is recommended to prevent development of skin cancer.  Recommend daily broad spectrum sunscreen SPF 30+ to sun-exposed areas, reapply every 2 hours as needed.  Recommend staying in the shade or wearing long sleeves, sun glasses (UVA+UVB protection) and wide brim hats (4-inch brim around the entire circumference of the hat). Call for new or changing lesions.  Return for schedule January - february .  IEleanor Blush, CMA, am acting as scribe for Alm Rhyme, MD.   Documentation: I have reviewed the above documentation for accuracy and completeness, and I agree with the above.  Alm Rhyme, MD

## 2024-05-18 NOTE — Patient Instructions (Signed)

## 2024-05-19 ENCOUNTER — Encounter: Payer: Self-pay | Admitting: Dermatology

## 2024-05-20 ENCOUNTER — Ambulatory Visit: Admitting: Dermatology

## 2025-03-09 ENCOUNTER — Ambulatory Visit: Admitting: Dermatology
